# Patient Record
Sex: Male | Born: 1995
Health system: Southern US, Community
[De-identification: ages and names within clinical notes are randomized; demographics above are authoritative.]

## PROBLEM LIST (undated history)

## (undated) DIAGNOSIS — R569 Unspecified convulsions: Secondary | ICD-10-CM

## (undated) DIAGNOSIS — T7840XA Allergy, unspecified, initial encounter: Secondary | ICD-10-CM

## (undated) DIAGNOSIS — S42023A Displaced fracture of shaft of unspecified clavicle, initial encounter for closed fracture: Secondary | ICD-10-CM

## (undated) HISTORY — DX: Allergy, unspecified, initial encounter: T78.40XA

## (undated) HISTORY — DX: Displaced fracture of shaft of unspecified clavicle, initial encounter for closed fracture: S42.023A

---
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
## 2016-01-18 ENCOUNTER — Ambulatory Visit (INDEPENDENT_AMBULATORY_CARE_PROVIDER_SITE_OTHER): Payer: 59 | Admitting: Family

## 2016-01-18 ENCOUNTER — Encounter: Payer: Self-pay | Admitting: Family

## 2016-01-18 VITALS — BP 116/68 | HR 85 | Temp 98.0°F | Ht 69.0 in | Wt 144.4 lb

## 2016-01-18 DIAGNOSIS — R0981 Nasal congestion: Secondary | ICD-10-CM | POA: Diagnosis not present

## 2016-01-18 MED ORDER — GUAIFENESIN ER 600 MG PO TB12: 1200.0000 mg | ORAL_TABLET | Freq: Two times a day (BID) | ORAL | Status: DC | PRN

## 2016-01-18 MED ORDER — FLUTICASONE PROPIONATE 50 MCG/ACT NA SUSP: 2.0000 | Freq: Every day | NASAL | Status: DC

## 2016-01-18 NOTE — Patient Instructions (Signed)
Flonase , alternating with nasal saline for nose.   Mucinex.   Increase intake of clear fluids. Congestion is best treated by hydration, when mucus is wetter, it is thinner, less sticky, and easier to expel from the body, either through coughing up drainage, or by blowing your nose.   Get plenty of rest.   Use saline nasal drops and blow your nose frequently. Run a humidifier at night and elevate the head of the bed. Vicks Vapor rub will help with congestion and cough. Steam showers and sinus massage for congestion.   Use Acetaminophen or Ibuprofen as needed for fever or pain. Avoid second hand smoke. Even the smallest exposure will worsen symptoms.   Over the counter medications you can try include Delsym for cough, a decongestant for congestion, and Mucinex or Robitussin as an expectorant. Be sure to just get the plain Mucinex or Robitussin that just has one medication (Guaifenesen). We don't recommend the combination products. Note, be sure to drink two glasses of water with each dose of Mucinex as the medication will not work well without adequate hydration.   You can also try a teaspoon of honey to see if this will help reduce cough. Throat lozenges can sometimes be beneficial as well.    This illness will typically last 7 - 10 days.   Please follow up with our clinic if you develop a fever greater than 101 F, symptoms worsen, or do not resolve in the next week.

## 2016-01-18 NOTE — Progress Notes (Signed)
Subjective:    Patient ID: Martin Gonzalez, male    DOB: 1996-08-02, 20 y.o.   MRN: [id number]   Martin Gonzalez is a 20 y.o. male who presents today for an acute visit.    HPI Comments: H/o seasonal allergies.   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 Was given Mucinex but has since run out.    Sinus Problem This is a new problem. The current episode started more than 1 month ago. The problem has been waxing and waning since onset. There has been no fever. The pain is mild. Associated symptoms include congestion and sinus pressure. Pertinent negatives include no chills, coughing, ear pain, headaches, neck pain, shortness of breath or sore throat. Treatments tried: zyrtec, 'sinus pills' The treatment provided mild relief.   Past Medical History  Diagnosis Date  . Allergy    Allergies: Review of patient's allergies indicates no known allergies. Current Outpatient Prescriptions on File Prior to Visit  Medication Sig Dispense Refill  . benzonatate (TESSALON) 200 MG capsule Take 1 capsule (200 mg total) by mouth 3 (three) times daily as needed for cough. (Patient not taking: Reported on 01/18/2016) 21 capsule 0   No current facility-administered medications on file prior to visit.    Social History  Substance Use Topics  . Smoking status: Never Smoker   . Smokeless tobacco: Never Used  . Alcohol Use: No    Review of Systems  Constitutional: Negative for fever and chills.  HENT: Positive for congestion and sinus pressure. Negative for ear pain and sore throat.   Respiratory: Negative for cough, shortness of breath and wheezing.   Cardiovascular: Negative for chest pain and palpitations.  Gastrointestinal: Negative for nausea and vomiting.  Musculoskeletal: Negative for neck pain.  Neurological: Negative for headaches.      Objective:    BP 116/68 mmHg  Pulse 85  Temp(Src) 98 F (36.7 C) (Oral)  Ht 5\' 9"  (1.753 m)  Wt 144 lb 6 oz  (65.488 kg)  BMI 21.31 kg/m2  SpO2 97%   Physical Exam  Constitutional: Vital signs are normal. He appears well-developed and well-nourished.  HENT:  Head: Normocephalic and atraumatic.  Right Ear: Hearing, tympanic membrane, external ear and ear canal normal. No drainage, swelling or tenderness. Tympanic membrane is not injected, not erythematous and not bulging. No middle ear effusion. No decreased hearing is noted.  Left Ear: Hearing, tympanic membrane, external ear and ear canal normal. No drainage, swelling or tenderness. Tympanic membrane is not injected, not erythematous and not bulging.  No middle ear effusion. No decreased hearing is noted.  Nose: Nose normal. Right sinus exhibits no maxillary sinus tenderness and no frontal sinus tenderness. Left sinus exhibits no maxillary sinus tenderness and no frontal sinus tenderness.  Mouth/Throat: Uvula is midline, oropharynx is clear and moist and mucous membranes are normal. No oropharyngeal exudate, posterior oropharyngeal edema, posterior oropharyngeal erythema or tonsillar abscesses.  Eyes: Conjunctivae are normal.  Cardiovascular: Regular rhythm and normal heart sounds.   Pulmonary/Chest: Effort normal and breath sounds normal. No respiratory distress. He has no wheezes. He has no rhonchi. He has no rales.  Lymphadenopathy:       Head (right side): No submental, no submandibular, no tonsillar, no preauricular, no posterior auricular and no occipital adenopathy present.       Head (left side): No submental, no submandibular, no tonsillar, no preauricular, no posterior auricular and no occipital adenopathy present.    He has no cervical  adenopathy.  Neurological: He is alert.  Skin: Skin is warm and dry.  Psychiatric: He has a normal mood and affect. His speech is normal and behavior is normal.  Vitals reviewed.      Assessment & Plan:    1. Sinus congestion Working diagnosis of viral versus allergic etiology. No evidence of  bacterial infection at this time. We both agreed on conservative therapy at this time.  - guaiFENesin (MUCINEX) 600 MG 12 hr tablet; Take 2 tablets (1,200 mg total) by mouth 2 (two) times daily as needed for cough or to loosen phlegm.  Dispense: 60 tablet; Refill: 1 - fluticasone (FLONASE) 50 MCG/ACT nasal spray; Place 2 sprays into both nostrils daily.  Dispense: 16 g; Refill: 2   I am having [name] maintain his benzonatate.   No orders of the defined types were placed in this encounter.     Start medications as prescribed and explained to patient on After Visit Summary ( AVS). Risks, benefits, and alternatives of the medications and treatment plan prescribed today were discussed, and patient expressed understanding.   Education regarding symptom management and diagnosis given to patient.   Follow-up:Plan follow-up as discussed or as needed if any worsening symptoms or change in condition.   Continue to follow with [name], MD for routine health maintenance.   Martin Poot and I agreed with plan.   Rennie Plowman, FNP

## 2016-01-18 NOTE — Progress Notes (Signed)
Pre visit review using our clinic review tool, if applicable. No additional management support is needed unless otherwise documented below in the visit note. 

## 2016-06-15 ENCOUNTER — Ambulatory Visit (INDEPENDENT_AMBULATORY_CARE_PROVIDER_SITE_OTHER): Payer: 59

## 2016-06-15 DIAGNOSIS — Z23 Encounter for immunization: Secondary | ICD-10-CM | POA: Diagnosis not present

## 2016-07-17 ENCOUNTER — Encounter: Payer: Self-pay | Admitting: Family

## 2016-07-17 ENCOUNTER — Ambulatory Visit (INDEPENDENT_AMBULATORY_CARE_PROVIDER_SITE_OTHER): Payer: 59 | Admitting: Family

## 2016-07-17 VITALS — BP 130/62 | HR 73 | Temp 98.3°F | Ht 69.0 in | Wt 150.0 lb

## 2016-07-17 DIAGNOSIS — Z202 Contact with and (suspected) exposure to infections with a predominantly sexual mode of transmission: Secondary | ICD-10-CM | POA: Diagnosis not present

## 2016-07-17 DIAGNOSIS — R0981 Nasal congestion: Secondary | ICD-10-CM

## 2016-07-17 NOTE — Patient Instructions (Signed)
Will call with test results  Mucinex- and lots of water.  Let me know if not better.

## 2016-07-17 NOTE — Progress Notes (Signed)
Subjective:    Patient ID: Martin Gonzalez, male    DOB: 05-02-1996, 20 y.o.   MRN: [id number]  CC: Martin Gonzalez is a 20 y.o. male who presents today for an acute visit.    HPI: Patient for acute visit for thick nasal congestion for one week, unchanged. No fevers, chills, sore throat, ear pain, wheezing, SOB. Tried some ibuprofen. Seasonal allergies.   Also desires STD testing after unprotected sex over the weekend with a male. No penile discharge, dysuria, penile pain.      HISTORY:  Past Medical History:  Diagnosis Date  . Allergy    History reviewed. No pertinent surgical history. Family History  Problem Relation Age of Onset  . Cancer Maternal Aunt     Breast  . Diabetes Maternal Grandmother     Allergies: Patient has no known allergies. Current Outpatient Prescriptions on File Prior to Visit  Medication Sig Dispense Refill  . benzonatate (TESSALON) 200 MG capsule Take 1 capsule (200 mg total) by mouth 3 (three) times daily as needed for cough. 21 capsule 0  . fluticasone (FLONASE) 50 MCG/ACT nasal spray Place 2 sprays into both nostrils daily. 16 g 2  . guaiFENesin (MUCINEX) 600 MG 12 hr tablet Take 2 tablets (1,200 mg total) by mouth 2 (two) times daily as needed for cough or to loosen phlegm. 60 tablet 1   No current facility-administered medications on file prior to visit.     Social History  Substance Use Topics  . Smoking status: Never Smoker  . Smokeless tobacco: Never Used  . Alcohol use No    Review of Systems  Constitutional: Negative for chills and fever.  HENT: Positive for congestion. Negative for ear pain, sinus pain, sinus pressure and sore throat.   Respiratory: Negative for cough, shortness of breath and wheezing.   Cardiovascular: Negative for chest pain and palpitations.  Gastrointestinal: Negative for nausea and vomiting.  Genitourinary: Negative for dysuria, genital sores, penile swelling, testicular pain and urgency.      Objective:      BP 130/62   Pulse 73   Temp 98.3 F (36.8 C) (Oral)   Ht 5\' 9"  (1.753 m)   Wt 150 lb (68 kg)   SpO2 97%   BMI 22.15 kg/m    Physical Exam  Constitutional: Vital signs are normal. He appears well-developed and well-nourished.  HENT:  Head: Normocephalic and atraumatic.  Right Ear: Hearing, tympanic membrane, external ear and ear canal normal. No drainage, swelling or tenderness. Tympanic membrane is not injected, not erythematous and not bulging. No middle ear effusion. No decreased hearing is noted.  Left Ear: Hearing, tympanic membrane, external ear and ear canal normal. No drainage, swelling or tenderness. Tympanic membrane is not injected, not erythematous and not bulging.  No middle ear effusion. No decreased hearing is noted.  Nose: Nose normal. Right sinus exhibits no maxillary sinus tenderness and no frontal sinus tenderness. Left sinus exhibits no maxillary sinus tenderness and no frontal sinus tenderness.  Mouth/Throat: Uvula is midline, oropharynx is clear and moist and mucous membranes are normal. No oropharyngeal exudate, posterior oropharyngeal edema, posterior oropharyngeal erythema or tonsillar abscesses.  Eyes: Conjunctivae are normal.  Cardiovascular: Regular rhythm and normal heart sounds.   Pulmonary/Chest: Effort normal and breath sounds normal. No respiratory distress. He has no wheezes. He has no rhonchi. He has no rales.  Lymphadenopathy:       Head (right side): No submental, no submandibular, no tonsillar, no preauricular, no posterior auricular  and no occipital adenopathy present.       Head (left side): No submental, no submandibular, no tonsillar, no preauricular, no posterior auricular and no occipital adenopathy present.    He has no cervical adenopathy.  Neurological: He is alert.  Skin: Skin is warm and dry.  Psychiatric: He has a normal mood and affect. His speech is normal and behavior is normal.  Vitals reviewed.      Assessment & Plan:   1. STD  exposure Asymptomatic. Testing as requested.   - GC/chlamydia probe amp, urine; Future - Acute Hep Panel & Hep B Surface Ab; Future - HIV antibody; Future - HSV(herpes smplx)abs-1+2(IgG+IgM)-bld; Future - RPR; Future - GC/chlamydia probe amp, urine - Acute Hep Panel & Hep B Surface Ab - HIV antibody - HSV(herpes smplx)abs-1+2(IgG+IgM)-bld - RPR  2. Sinus congestion Afebrile. Patient and I agreed to defer antibiotics at this time and treat conservatively with over-the-counter expectorant, Mucinex for the next couple of days. If symptoms do not improve or certainly worsen, patient wil let me know.  Return precautions given.    I am having Martin Gonzalez maintain his benzonatate, guaiFENesin, and fluticasone.   No orders of the defined types were placed in this encounter.   Return precautions given.   Risks, benefits, and alternatives of the medications and treatment plan prescribed today were discussed, and patient expressed understanding.   Education regarding symptom management and diagnosis given to patient on AVS.  Continue to follow with Martin BoydenJavier Gutierrez, MD for routine health maintenance.   Martin Gonzalez and I agreed with plan.   Martin PlowmanMargaret Arnett, FNP

## 2016-07-18 ENCOUNTER — Encounter: Payer: Self-pay | Admitting: Family

## 2016-07-18 NOTE — Progress Notes (Signed)
Pre visit review using our clinic review tool, if applicable. No additional management support is needed unless otherwise documented below in the visit note. 

## 2016-07-19 ENCOUNTER — Telehealth: Payer: Self-pay

## 2016-07-19 ENCOUNTER — Other Ambulatory Visit: Payer: Self-pay

## 2016-07-19 ENCOUNTER — Other Ambulatory Visit (HOSPITAL_COMMUNITY)
Admission: RE | Admit: 2016-07-19 | Discharge: 2016-07-19 | Disposition: A | Discharge status: Home / Self Care | Payer: 59 | Source: Ambulatory Visit | Attending: Family | Admitting: Family

## 2016-07-19 DIAGNOSIS — Z202 Contact with and (suspected) exposure to infections with a predominantly sexual mode of transmission: Secondary | ICD-10-CM

## 2016-07-19 DIAGNOSIS — Z113 Encounter for screening for infections with a predominantly sexual mode of transmission: Secondary | ICD-10-CM | POA: Diagnosis not present

## 2016-07-19 LAB — URINE CYTOLOGY ANCILLARY ONLY
Chlamydia: NEGATIVE
Neisseria Gonorrhea: NEGATIVE

## 2016-07-19 LAB — RPR

## 2016-07-19 LAB — ACUTE HEP PANEL AND HEP B SURFACE AB
HCV Ab: NEGATIVE
HEP A IGM: NONREACTIVE
HEP B C IGM: NONREACTIVE
Hepatitis B Surface Ag: NEGATIVE

## 2016-07-19 LAB — HIV ANTIBODY (ROUTINE TESTING W REFLEX): HIV 1&2 Ab, 4th Generation: NONREACTIVE

## 2016-07-19 NOTE — Telephone Encounter (Signed)
Noted. I have called [facility] and made them aware. They will send for both.

## 2016-07-19 NOTE — Telephone Encounter (Signed)
[facility] called regarding HSV I&II IgG,IgM lab order. The test code you ordered is no longer valid and they need authorization to change it. They have separate test codes for HSV I&II IgG and HSV I&II IgM now. Would you prefer to have IgG or IgM; or do you want both?

## 2016-07-19 NOTE — Telephone Encounter (Signed)
[facility] called regarding order for g/c probe. Order was placed incorrectly. I have changed the order so that it is correct. Resulting agency was not set to [facility].

## 2016-07-19 NOTE — Telephone Encounter (Signed)
Both is fine.thanks!

## 2016-07-20 LAB — HSV(HERPES SIMPLEX VRS) I + II AB-IGG: HSV 1 GLYCOPROTEIN G AB, IGG: 56.3 {index} — AB (ref <0.90)

## 2016-07-23 LAB — HSV 1/2 AB (IGM), IFA W/RFLX TITER
HSV 1 IGM SCREEN: NEGATIVE
HSV 2 IgM Screen: NEGATIVE

## 2016-07-24 ENCOUNTER — Telehealth: Payer: Self-pay | Admitting: Family Medicine

## 2016-07-24 NOTE — Telephone Encounter (Signed)
Patient has been informed.

## 2016-07-24 NOTE — Telephone Encounter (Signed)
Pt lvm stating that he was returning Brocks phone call.

## 2016-09-11 DIAGNOSIS — S42023A Displaced fracture of shaft of unspecified clavicle, initial encounter for closed fracture: Secondary | ICD-10-CM

## 2016-09-11 HISTORY — DX: Displaced fracture of shaft of unspecified clavicle, initial encounter for closed fracture: S42.023A

## 2017-01-05 ENCOUNTER — Ambulatory Visit (INDEPENDENT_AMBULATORY_CARE_PROVIDER_SITE_OTHER): Payer: 59 | Admitting: Family Medicine

## 2017-01-05 ENCOUNTER — Encounter: Payer: Self-pay | Admitting: Family Medicine

## 2017-01-05 VITALS — BP 124/78 | HR 83 | Temp 97.4°F | Wt 153.6 lb

## 2017-01-05 DIAGNOSIS — B9789 Other viral agents as the cause of diseases classified elsewhere: Secondary | ICD-10-CM | POA: Diagnosis not present

## 2017-01-05 DIAGNOSIS — J069 Acute upper respiratory infection, unspecified: Secondary | ICD-10-CM | POA: Diagnosis not present

## 2017-01-05 NOTE — Patient Instructions (Signed)
For nasal congestion you can use Afrin nasal spray for 3 days max, Sudafed, saline nasal spray (generic is fine for all). For cough you can try Delsym. Drink enough fluids to make your urine light yellow. For fever/chill/muscle aches you can take over the counter acetaminophen or ibuprofen.  You can take a long acting antihistamine like Zyrtec, Claritin or Allegra (generic is fine) Please come back in if you are not better in 5-7 days or if you develop wheezing, shortness of breath or persistent vomiting.

## 2017-01-05 NOTE — Progress Notes (Signed)
Pre visit review using our clinic review tool, if applicable. No additional management support is needed unless otherwise documented below in the visit note. 

## 2017-01-05 NOTE — Progress Notes (Signed)
   Subjective:    Patient ID: Martin Gonzalez, male    DOB: 09/11/1996, 21 y.o.   MRN: [id number]  HPI This is a 21 yo male who presents today with 1 week of nasal congestion and sore throat. A little non productive cough. Nasal drainage bloody at times, mostly yellow. No headache, feeling better today.  Some SOB, no wheeze. No fever. No history of asthma or smoking. Has taken some "Allergy Relief," medication without improvement. A close friend has similar symptoms. Requests work note.  Works Engineering geologist at [employer]. Planning to go back to school next semester.   Past Medical History:  Diagnosis Date  . Allergy    No past surgical history on file. Family History  Problem Relation Age of Onset  . Cancer Maternal Aunt     Breast  . Diabetes Maternal Grandmother    Social History  Substance Use Topics  . Smoking status: Never Smoker  . Smokeless tobacco: Never Used  . Alcohol use No      Review of Systems Per HPI    Objective:   Physical Exam  Constitutional: He is oriented to person, place, and time. He appears well-developed and well-nourished. No distress.  HENT:  Head: Normocephalic and atraumatic.  Right Ear: External ear normal.  Left Ear: External ear normal.  Nose: Nose normal.  Mouth/Throat: Oropharynx is clear and moist. No oropharyngeal exudate.  Eyes: Conjunctivae are normal.  Neck: Normal range of motion. Neck supple.  Cardiovascular: Normal rate, regular rhythm and normal heart sounds.   Pulmonary/Chest: Effort normal and breath sounds normal.  Neurological: He is alert and oriented to person, place, and time.  Skin: Skin is warm and dry. He is not diaphoretic.  Psychiatric: He has a normal mood and affect. His behavior is normal. Judgment and thought content normal.  Vitals reviewed.        BP 124/78 (BP Location: Right Arm, Patient Position: Sitting, Cuff Size: Normal)   Pulse 83   Temp 97.4 F (36.3 C) (Oral)   Wt 153 lb 9.6 oz (69.7 kg)   SpO2 98%    BMI 22.68 kg/m  Wt Readings from Last 3 Encounters:  01/05/17 153 lb 9.6 oz (69.7 kg)  07/18/16 150 lb (68 kg) (42 %, Z= -0.21)*  01/18/16 144 lb 6 oz (65.5 kg) (35 %, Z= -0.39)*   * Growth percentiles are based on CDC 2-20 Years data.    Assessment & Plan:  1. Viral URI with cough  Patient Instructions  For nasal congestion you can use Afrin nasal spray for 3 days max, Sudafed, saline nasal spray (generic is fine for all). For cough you can try Delsym. Drink enough fluids to make your urine light yellow. For fever/chill/muscle aches you can take over the counter acetaminophen or ibuprofen.  You can take a long acting antihistamine like Zyrtec, Claritin or Allegra (generic is fine) Please come back in if you are not better in 5-7 days or if you develop wheezing, shortness of breath or persistent vomiting.    Olean Ree, FNP-BC  [facility] [place], [facility]  01/05/2017 2:02 PM

## 2017-06-19 ENCOUNTER — Emergency Department
Admission: EM | Admit: 2017-06-19 | Discharge: 2017-06-19 | Disposition: A | Discharge status: Home / Self Care | Payer: 59 | Attending: Student in an Organized Health Care Education/Training Program | Admitting: Student in an Organized Health Care Education/Training Program

## 2017-06-19 ENCOUNTER — Emergency Department: Payer: 59

## 2017-06-19 ENCOUNTER — Encounter: Payer: Self-pay | Admitting: Emergency Medicine

## 2017-06-19 DIAGNOSIS — S4992XA Unspecified injury of left shoulder and upper arm, initial encounter: Secondary | ICD-10-CM | POA: Diagnosis present

## 2017-06-19 DIAGNOSIS — M25512 Pain in left shoulder: Secondary | ICD-10-CM | POA: Diagnosis not present

## 2017-06-19 DIAGNOSIS — Y9389 Activity, other specified: Secondary | ICD-10-CM | POA: Diagnosis not present

## 2017-06-19 DIAGNOSIS — Y92410 Unspecified street and highway as the place of occurrence of the external cause: Secondary | ICD-10-CM | POA: Diagnosis not present

## 2017-06-19 DIAGNOSIS — Z79899 Other long term (current) drug therapy: Secondary | ICD-10-CM | POA: Diagnosis not present

## 2017-06-19 DIAGNOSIS — Y998 Other external cause status: Secondary | ICD-10-CM | POA: Insufficient documentation

## 2017-06-19 DIAGNOSIS — S42022A Displaced fracture of shaft of left clavicle, initial encounter for closed fracture: Secondary | ICD-10-CM | POA: Insufficient documentation

## 2017-06-19 DIAGNOSIS — S42002A Fracture of unspecified part of left clavicle, initial encounter for closed fracture: Secondary | ICD-10-CM | POA: Diagnosis not present

## 2017-06-19 MED ORDER — OXYCODONE-ACETAMINOPHEN 5-325 MG PO TABS: 1.0000 | ORAL_TABLET | Freq: Four times a day (QID) | ORAL | 0 refills | Status: DC | PRN

## 2017-06-19 MED ORDER — OXYCODONE-ACETAMINOPHEN 5-325 MG PO TABS
1.0000 | ORAL_TABLET | Freq: Once | ORAL | Status: AC
  Administered 2017-06-19: 1 via ORAL
  Filled 2017-06-19: qty 1

## 2017-06-19 MED ORDER — FENTANYL CITRATE (PF) 100 MCG/2ML IJ SOLN
50.0000 ug | Freq: Once | INTRAMUSCULAR | Status: AC
  Administered 2017-06-19: 50 ug via INTRAVENOUS
  Filled 2017-06-19: qty 2

## 2017-06-19 MED ORDER — NAPROXEN 500 MG PO TABS: 500.0000 mg | ORAL_TABLET | Freq: Two times a day (BID) | ORAL | 0 refills | Status: DC
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
## 2017-06-21 DIAGNOSIS — S42002A Fracture of unspecified part of left clavicle, initial encounter for closed fracture: Secondary | ICD-10-CM | POA: Diagnosis not present

## 2017-07-05 DIAGNOSIS — S42002D Fracture of unspecified part of left clavicle, subsequent encounter for fracture with routine healing: Secondary | ICD-10-CM | POA: Diagnosis not present

## 2017-07-11 ENCOUNTER — Encounter: Payer: Self-pay | Admitting: Family Medicine

## 2018-05-13 ENCOUNTER — Emergency Department: Payer: 59

## 2018-05-13 ENCOUNTER — Emergency Department
Admission: EM | Admit: 2018-05-13 | Discharge: 2018-05-13 | Disposition: A | Discharge status: Home / Self Care | Payer: 59 | Attending: Emergency Medicine | Admitting: Emergency Medicine

## 2018-05-13 DIAGNOSIS — Y998 Other external cause status: Secondary | ICD-10-CM | POA: Diagnosis not present

## 2018-05-13 DIAGNOSIS — Y939 Activity, unspecified: Secondary | ICD-10-CM | POA: Insufficient documentation

## 2018-05-13 DIAGNOSIS — S43015A Anterior dislocation of left humerus, initial encounter: Secondary | ICD-10-CM | POA: Diagnosis not present

## 2018-05-13 DIAGNOSIS — Y33XXXA Other specified events, undetermined intent, initial encounter: Secondary | ICD-10-CM | POA: Insufficient documentation

## 2018-05-13 DIAGNOSIS — Y929 Unspecified place or not applicable: Secondary | ICD-10-CM | POA: Insufficient documentation

## 2018-05-13 DIAGNOSIS — S43005A Unspecified dislocation of left shoulder joint, initial encounter: Secondary | ICD-10-CM | POA: Diagnosis not present

## 2018-05-13 DIAGNOSIS — Z79899 Other long term (current) drug therapy: Secondary | ICD-10-CM | POA: Diagnosis not present

## 2018-05-13 DIAGNOSIS — R569 Unspecified convulsions: Secondary | ICD-10-CM | POA: Diagnosis not present

## 2018-05-13 LAB — URINE DRUG SCREEN, QUALITATIVE (ARMC ONLY)
AMPHETAMINES, UR SCREEN: NOT DETECTED
BARBITURATES, UR SCREEN: NOT DETECTED
Cannabinoid 50 Ng, Ur ~~LOC~~: NOT DETECTED
Cocaine Metabolite,Ur ~~LOC~~: NOT DETECTED
MDMA (ECSTASY) UR SCREEN: NOT DETECTED
Methadone Scn, Ur: NOT DETECTED
Opiate, Ur Screen: NOT DETECTED
Phencyclidine (PCP) Ur S: NOT DETECTED
Tricyclic, Ur Screen: NOT DETECTED

## 2018-05-13 LAB — CBC WITH DIFFERENTIAL/PLATELET
Basophils Absolute: 0 10*3/uL (ref 0–0.1)
Basophils Relative: 0 %
EOS ABS: 0 10*3/uL (ref 0–0.7)
Eosinophils Relative: 0 %
HCT: 44.6 % (ref 40.0–52.0)
HEMOGLOBIN: 15.7 g/dL (ref 13.0–18.0)
LYMPHS ABS: 3.9 10*3/uL — AB (ref 1.0–3.6)
Lymphocytes Relative: 26 %
MCH: 31.9 pg (ref 26.0–34.0)
MCHC: 35.2 g/dL (ref 32.0–36.0)
MCV: 90.7 fL (ref 80.0–100.0)
MONO ABS: 1 10*3/uL (ref 0.2–1.0)
MONOS PCT: 7 %
NEUTROS PCT: 67 %
Neutro Abs: 10 10*3/uL — ABNORMAL HIGH (ref 1.4–6.5)
Platelets: 305 10*3/uL (ref 150–440)
RBC: 4.92 MIL/uL (ref 4.40–5.90)
RDW: 13.2 % (ref 11.5–14.5)
WBC: 15 10*3/uL — ABNORMAL HIGH (ref 3.8–10.6)

## 2018-05-13 LAB — BASIC METABOLIC PANEL
Anion gap: 13 (ref 5–15)
BUN: 15 mg/dL (ref 6–20)
CHLORIDE: 103 mmol/L (ref 98–111)
CO2: 23 mmol/L (ref 22–32)
CREATININE: 1.26 mg/dL — AB (ref 0.61–1.24)
Calcium: 9.7 mg/dL (ref 8.9–10.3)
GFR calc Af Amer: >60 mL/min (ref >60)
GFR calc non Af Amer: >60 mL/min (ref >60)
Glucose, Bld: 142 mg/dL — ABNORMAL HIGH (ref 70–99)
Potassium: 3.7 mmol/L (ref 3.5–5.1)
SODIUM: 139 mmol/L (ref 135–145)

## 2018-05-13 LAB — TROPONIN I

## 2018-05-13 NOTE — ED Triage Notes (Signed)
Pt arrived from [employer] via EMS with reports of a seizure. Pt is alert but not oriented. VS per EMS BS-119 BP-130/84 HR-86 O2sat-97% No Hx of Seizures. Pt is diaphoretic and restless. Pt has obvious deformity to collarbone. Pt states its an injury from last October. Pt denies injury and denies hitting head.
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
## 2018-05-13 NOTE — Discharge Instructions (Addendum)
As we discussed please do not drive, go up on roofs, go swimming or put yourself or others in a dangerous position if you were to have another seizure until cleared by neurology.  Please follow-up with orthopedics for the shoulder dislocation.

## 2018-05-16 DIAGNOSIS — S43005A Unspecified dislocation of left shoulder joint, initial encounter: Secondary | ICD-10-CM | POA: Diagnosis not present

## 2018-05-20 ENCOUNTER — Encounter: Payer: Self-pay | Admitting: Family Medicine

## 2018-05-20 ENCOUNTER — Ambulatory Visit: Payer: 59 | Admitting: Family Medicine

## 2018-05-20 VITALS — BP 118/78 | HR 70 | Temp 97.8°F | Ht 69.0 in | Wt 159.5 lb

## 2018-05-20 DIAGNOSIS — R569 Unspecified convulsions: Secondary | ICD-10-CM | POA: Diagnosis not present

## 2018-05-20 DIAGNOSIS — G40909 Epilepsy, unspecified, not intractable, without status epilepticus: Secondary | ICD-10-CM | POA: Insufficient documentation

## 2018-05-20 DIAGNOSIS — M24412 Recurrent dislocation, left shoulder: Secondary | ICD-10-CM | POA: Insufficient documentation

## 2018-05-20 DIAGNOSIS — S43015A Anterior dislocation of left humerus, initial encounter: Secondary | ICD-10-CM | POA: Diagnosis not present

## 2018-05-20 DIAGNOSIS — Z23 Encounter for immunization: Secondary | ICD-10-CM

## 2018-05-20 NOTE — Patient Instructions (Addendum)
Flu shot today.  Keep follow up with Dr Malvin Johns later today.  Good to see you today. Call us with questions.  Out of work through Friday.

## 2018-05-20 NOTE — Assessment & Plan Note (Signed)
S/p relocation in ER. He has seen ortho and has planned PT for strengthening - first session will be this Friday.

## 2018-05-20 NOTE — Assessment & Plan Note (Addendum)
Likely seizure given concommittant shoulder dislocation. Unclear cause - CT was benign, no electrolyte abnormalities on labs, normal cbg (and he had just eaten prior to event). Upcoming neuro appt. Discussed possible need to avoid driving for 6 months.  Written out of work through the rest of this week.  Encouraged regular meal schedule, better sleep hygiene with goal 7-8 hours nightly, ensure good hydration status.
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
## 2018-05-24 DIAGNOSIS — R600 Localized edema: Secondary | ICD-10-CM | POA: Diagnosis not present

## 2018-05-24 DIAGNOSIS — M25512 Pain in left shoulder: Secondary | ICD-10-CM | POA: Diagnosis not present

## 2018-05-24 DIAGNOSIS — M25611 Stiffness of right shoulder, not elsewhere classified: Secondary | ICD-10-CM | POA: Diagnosis not present

## 2018-05-24 DIAGNOSIS — M25612 Stiffness of left shoulder, not elsewhere classified: Secondary | ICD-10-CM | POA: Diagnosis not present

## 2018-05-24 DIAGNOSIS — M25511 Pain in right shoulder: Secondary | ICD-10-CM | POA: Diagnosis not present

## 2018-05-26 DIAGNOSIS — R569 Unspecified convulsions: Secondary | ICD-10-CM | POA: Diagnosis not present

## 2018-05-28 ENCOUNTER — Other Ambulatory Visit: Payer: Self-pay | Admitting: Neurology

## 2018-05-28 DIAGNOSIS — R569 Unspecified convulsions: Secondary | ICD-10-CM

## 2018-06-17 ENCOUNTER — Ambulatory Visit
Admission: RE | Admit: 2018-06-17 | Discharge: 2018-06-17 | Disposition: A | Discharge status: Home / Self Care | Payer: 59 | Source: Ambulatory Visit | Attending: Neurology | Admitting: Neurology

## 2018-06-17 DIAGNOSIS — R569 Unspecified convulsions: Secondary | ICD-10-CM | POA: Insufficient documentation

## 2018-06-17 DIAGNOSIS — R55 Syncope and collapse: Secondary | ICD-10-CM | POA: Diagnosis not present

## 2018-06-17 MED ORDER — GADOBUTROL 1 MMOL/ML IV SOLN
7.5000 mL | Freq: Once | INTRAVENOUS | Status: AC | PRN
  Administered 2018-06-17: 7.5 mL via INTRAVENOUS

## 2018-08-09 ENCOUNTER — Emergency Department: Payer: 59

## 2018-08-09 ENCOUNTER — Emergency Department
Admission: EM | Admit: 2018-08-09 | Discharge: 2018-08-09 | Disposition: A | Discharge status: Home / Self Care | Payer: 59 | Attending: Emergency Medicine | Admitting: Emergency Medicine

## 2018-08-09 ENCOUNTER — Other Ambulatory Visit: Payer: Self-pay

## 2018-08-09 ENCOUNTER — Emergency Department: Payer: 59 | Admitting: Certified Registered"

## 2018-08-09 ENCOUNTER — Encounter: Payer: Self-pay | Admitting: Emergency Medicine

## 2018-08-09 ENCOUNTER — Encounter
Admission: EM | Disposition: A | Discharge status: Home / Self Care | Payer: Self-pay | Source: Home / Self Care | Attending: Emergency Medicine

## 2018-08-09 DIAGNOSIS — G40909 Epilepsy, unspecified, not intractable, without status epilepticus: Secondary | ICD-10-CM | POA: Diagnosis not present

## 2018-08-09 DIAGNOSIS — S43005A Unspecified dislocation of left shoulder joint, initial encounter: Secondary | ICD-10-CM | POA: Diagnosis not present

## 2018-08-09 DIAGNOSIS — Y990 Civilian activity done for income or pay: Secondary | ICD-10-CM | POA: Insufficient documentation

## 2018-08-09 DIAGNOSIS — Q899 Congenital malformation, unspecified: Secondary | ICD-10-CM | POA: Diagnosis present

## 2018-08-09 DIAGNOSIS — M24412 Recurrent dislocation, left shoulder: Secondary | ICD-10-CM

## 2018-08-09 DIAGNOSIS — R569 Unspecified convulsions: Secondary | ICD-10-CM

## 2018-08-09 DIAGNOSIS — S43015A Anterior dislocation of left humerus, initial encounter: Secondary | ICD-10-CM | POA: Insufficient documentation

## 2018-08-09 DIAGNOSIS — S43035A Inferior dislocation of left humerus, initial encounter: Secondary | ICD-10-CM | POA: Diagnosis not present

## 2018-08-09 DIAGNOSIS — S43002A Unspecified subluxation of left shoulder joint, initial encounter: Secondary | ICD-10-CM | POA: Diagnosis not present

## 2018-08-09 HISTORY — PX: SHOULDER CLOSED REDUCTION: SHX1051

## 2018-08-09 HISTORY — DX: Unspecified convulsions: R56.9

## 2018-08-09 LAB — URINE DRUG SCREEN, QUALITATIVE (ARMC ONLY)
Amphetamines, Ur Screen: NOT DETECTED
BARBITURATES, UR SCREEN: NOT DETECTED
Benzodiazepine, Ur Scrn: POSITIVE — AB
COCAINE METABOLITE, UR ~~LOC~~: NOT DETECTED
Cannabinoid 50 Ng, Ur ~~LOC~~: NOT DETECTED
MDMA (ECSTASY) UR SCREEN: NOT DETECTED
METHADONE SCREEN, URINE: NOT DETECTED
Opiate, Ur Screen: NOT DETECTED
Phencyclidine (PCP) Ur S: NOT DETECTED
TRICYCLIC, UR SCREEN: NOT DETECTED

## 2018-08-09 LAB — CBC WITH DIFFERENTIAL/PLATELET
Abs Immature Granulocytes: 0.17 10*3/uL — ABNORMAL HIGH (ref 0.00–0.07)
BASOS ABS: 0.1 10*3/uL (ref 0.0–0.1)
Basophils Relative: 0 %
EOS ABS: 0 10*3/uL (ref 0.0–0.5)
Eosinophils Relative: 0 %
HEMATOCRIT: 51.6 % (ref 39.0–52.0)
Hemoglobin: 16.1 g/dL (ref 13.0–17.0)
Immature Granulocytes: 1 %
LYMPHS ABS: 3.4 10*3/uL (ref 0.7–4.0)
Lymphocytes Relative: 22 %
MCH: 30.3 pg (ref 26.0–34.0)
MCHC: 31.2 g/dL (ref 30.0–36.0)
MCV: 97.2 fL (ref 80.0–100.0)
Monocytes Absolute: 1 10*3/uL (ref 0.1–1.0)
Monocytes Relative: 6 %
NEUTROS PCT: 71 %
NRBC: 0 % (ref 0.0–0.2)
Neutro Abs: 11.2 10*3/uL — ABNORMAL HIGH (ref 1.7–7.7)
Platelets: 283 10*3/uL (ref 150–400)
RBC: 5.31 MIL/uL (ref 4.22–5.81)
RDW: 11.9 % (ref 11.5–15.5)
WBC: 15.9 10*3/uL — ABNORMAL HIGH (ref 4.0–10.5)

## 2018-08-09 LAB — COMPREHENSIVE METABOLIC PANEL
ALBUMIN: 5.4 g/dL — AB (ref 3.5–5.0)
ALK PHOS: 70 U/L (ref 38–126)
ALT: 20 U/L (ref 0–44)
ANION GAP: 26 — AB (ref 5–15)
AST: 44 U/L — ABNORMAL HIGH (ref 15–41)
BUN: 17 mg/dL (ref 6–20)
CALCIUM: 10.1 mg/dL (ref 8.9–10.3)
CHLORIDE: 101 mmol/L (ref 98–111)
CO2: 15 mmol/L — AB (ref 22–32)
Creatinine, Ser: 1.4 mg/dL — ABNORMAL HIGH (ref 0.61–1.24)
GFR calc Af Amer: >60 mL/min (ref >60)
GFR calc non Af Amer: >60 mL/min (ref >60)
GLUCOSE: 170 mg/dL — AB (ref 70–99)
Potassium: 4.1 mmol/L (ref 3.5–5.1)
SODIUM: 142 mmol/L (ref 135–145)
Total Bilirubin: 0.8 mg/dL (ref 0.3–1.2)
Total Protein: 9.4 g/dL — ABNORMAL HIGH (ref 6.5–8.1)

## 2018-08-09 LAB — ETHANOL: Alcohol, Ethyl (B): <10 mg/dL (ref <10)

## 2018-08-09 SURGERY — CLOSED REDUCTION, SHOULDER
Anesthesia: General | Laterality: Left

## 2018-08-09 MED ORDER — PROPOFOL 10 MG/ML IV BOLUS
INTRAVENOUS | Status: DC | PRN
  Administered 2018-08-09: 150 mg via INTRAVENOUS

## 2018-08-09 MED ORDER — ROCURONIUM BROMIDE 50 MG/5ML IV SOLN
INTRAVENOUS | Status: AC
  Filled 2018-08-09: qty 1

## 2018-08-09 MED ORDER — LIDOCAINE HCL (CARDIAC) PF 100 MG/5ML IV SOSY
PREFILLED_SYRINGE | INTRAVENOUS | Status: DC | PRN
  Administered 2018-08-09: 100 mg via INTRAVENOUS

## 2018-08-09 MED ORDER — PROPOFOL 10 MG/ML IV BOLUS
INTRAVENOUS | Status: AC
  Filled 2018-08-09: qty 20

## 2018-08-09 MED ORDER — MIDAZOLAM HCL 2 MG/2ML IJ SOLN
INTRAMUSCULAR | Status: AC
  Filled 2018-08-09: qty 2

## 2018-08-09 MED ORDER — MIDAZOLAM HCL 2 MG/2ML IJ SOLN
INTRAMUSCULAR | Status: DC | PRN
  Administered 2018-08-09: 2 mg via INTRAVENOUS

## 2018-08-09 MED ORDER — LACTATED RINGERS IV SOLN
INTRAVENOUS | Status: DC | PRN
  Administered 2018-08-09: 20:00:00 via INTRAVENOUS

## 2018-08-09 MED ORDER — SUCCINYLCHOLINE CHLORIDE 20 MG/ML IJ SOLN
INTRAMUSCULAR | Status: AC
  Filled 2018-08-09: qty 1

## 2018-08-09 MED ORDER — LIDOCAINE HCL (PF) 2 % IJ SOLN
INTRAMUSCULAR | Status: AC
  Filled 2018-08-09: qty 10

## 2018-08-09 MED ORDER — LEVETIRACETAM 500 MG PO TABS: 500.0000 mg | ORAL_TABLET | Freq: Two times a day (BID) | ORAL | 1 refills | Status: DC

## 2018-08-09 MED ORDER — LEVETIRACETAM IN NACL 1500 MG/100ML IV SOLN
1500.0000 mg | Freq: Once | INTRAVENOUS | Status: AC
  Administered 2018-08-09: 1500 mg via INTRAVENOUS
  Filled 2018-08-09: qty 100

## 2018-08-09 MED ORDER — ONDANSETRON HCL 4 MG/2ML IJ SOLN
INTRAMUSCULAR | Status: DC | PRN
  Administered 2018-08-09: 4 mg via INTRAVENOUS

## 2018-08-09 MED ORDER — FENTANYL CITRATE (PF) 100 MCG/2ML IJ SOLN
INTRAMUSCULAR | Status: AC
  Filled 2018-08-09: qty 2

## 2018-08-09 MED ORDER — FENTANYL CITRATE (PF) 100 MCG/2ML IJ SOLN: 25.0000 ug | INTRAMUSCULAR | Status: DC | PRN

## 2018-08-09 MED ORDER — SUCCINYLCHOLINE CHLORIDE 20 MG/ML IJ SOLN
INTRAMUSCULAR | Status: DC | PRN
  Administered 2018-08-09: 100 mg via INTRAVENOUS

## 2018-08-09 MED ORDER — HYDROCODONE-ACETAMINOPHEN 5-325 MG PO TABS: 1.0000 | ORAL_TABLET | ORAL | 0 refills | Status: DC | PRN

## 2018-08-09 MED ORDER — FENTANYL CITRATE (PF) 100 MCG/2ML IJ SOLN
INTRAMUSCULAR | Status: DC | PRN
  Administered 2018-08-09: 100 ug via INTRAVENOUS

## 2018-08-09 MED ORDER — SODIUM CHLORIDE 0.9 % IV BOLUS
1000.0000 mL | Freq: Once | INTRAVENOUS | Status: AC
  Administered 2018-08-09: 1000 mL via INTRAVENOUS

## 2018-08-09 MED ORDER — ONDANSETRON HCL 4 MG/2ML IJ SOLN
INTRAMUSCULAR | Status: AC
  Filled 2018-08-09: qty 2

## 2018-08-09 MED ORDER — ONDANSETRON HCL 4 MG/2ML IJ SOLN: 4.0000 mg | Freq: Once | INTRAMUSCULAR | Status: DC | PRN

## 2018-08-09 MED ORDER — ROCURONIUM BROMIDE 100 MG/10ML IV SOLN
INTRAVENOUS | Status: DC | PRN
  Administered 2018-08-09: 5 mg via INTRAVENOUS

## 2018-08-09 SURGICAL SUPPLY — 1 items: IMMOBILIZER SHDR MD LX WHT (SOFTGOODS) ×3 IMPLANT

## 2018-08-09 NOTE — Anesthesia Postprocedure Evaluation (Signed)
Anesthesia Post Note  Patient: [place]  Procedure(s) Performed: CLOSED REDUCTION SHOULDER (Left )  Patient location during evaluation: PACU Anesthesia Type: General Level of consciousness: awake and alert Pain management: pain level controlled Vital Signs Assessment: post-procedure vital signs reviewed and stable Respiratory status: spontaneous breathing and respiratory function stable Cardiovascular status: stable Anesthetic complications: no     Last Vitals:  Vitals:   08/09/18 1800 08/09/18 2036  BP: (!) 123/96 122/74  Pulse: (!) 108 95  Resp: 20 14  Temp:  36.8 C  SpO2: 100% 100%    Last Pain:  Vitals:   08/09/18 2036  TempSrc:   PainSc: Asleep                 Obaloluwa Delatte K

## 2018-08-09 NOTE — Transfer of Care (Signed)
Immediate Anesthesia Transfer of Care Note  Patient: [place]  Procedure(s) Performed: CLOSED REDUCTION SHOULDER (Left )  Patient Location: PACU  Anesthesia Type:General  Level of Consciousness: sedated  Airway & Oxygen Therapy: Patient connected to face mask oxygen  Post-op Assessment: Post -op Vital signs reviewed and stable  Post vital signs: stable  Last Vitals:  Vitals Value Taken Time  BP 122/74 08/09/2018  8:36 PM  Temp 36.8 C 08/09/2018  8:36 PM  Pulse 95 08/09/2018  8:37 PM  Resp 20 08/09/2018  8:37 PM  SpO2 100 % 08/09/2018  8:37 PM  Vitals shown include unvalidated device data.  Last Pain:  Vitals:   08/09/18 2036  TempSrc:   PainSc: Asleep         Complications: No apparent anesthesia complications

## 2018-08-09 NOTE — Op Note (Signed)
08/09/2018  8:38 PM  Patient:   [name]  Pre-Op Diagnosis:   Irreducible anteroinferior glenohumeral dislocation, left shoulder.  Post-Op Diagnosis:   Same.  Procedure:   Closed reduction under general anesthesia of anterior inferior glenohumeral dislocation, left shoulder.  Surgeon:   Maryagnes AmosJ. Jeffrey Poggi, MD  Assistant:   None  Anesthesia:   GET  Findings:   As above.  Complications:   None  Fluids:   200 cc crystalloid  EBL:   0 cc  UOP:   None  TT:   None  Drains:   None  Closure:   None necessary  Brief Clinical Note:   The patient is a 22 year old male who apparently dislocated his left shoulder today when he suffered a seizure while at work at [employer]. X-rays in the emergency room demonstrated an anteroinferior dislocation of his left glenohumeral joint. Two attempts were made to try to reduce the shoulder in the emergency room by the ER physician without success.  The patient is brought to the emergency room for an attempted closed reduction versus possible open reduction of his left shoulder dislocation.  Procedure:   The patient was brought into the operating room and lain in the supine position.  After adequate general endotracheal intubation and anesthesia were obtained, the patient's shoulder was reduced using the technique of traction and countertraction.  Upon reduction, the shoulder exhibited smooth motion.  Post reduction images using the portable x-ray machine demonstrated anatomic reduction of his glenohumeral joint.  A large Hill-Sachs lesion was noted posterosuperiorly as expected.  The patient was placed into a shoulder immobilizer before he was awakened, extubated, and returned to the recovery room in satisfactory condition after tolerating the procedure well.

## 2018-08-09 NOTE — ED Triage Notes (Signed)
PT to ER via EMS from work after having a grand mal seizure.  Pt was post ictal on EMS arrival.  Pt had second grand mal seizure en route, EMS administered 2mg  IV Versed PTA

## 2018-08-09 NOTE — Discharge Instructions (Addendum)
Seizures may happen at any time. It is important to take certain precautions to maintain your safety.  Do not drive or operate heavy machinery until cleared by neurology.  Follow up with your doctor in 1-3 days.  If you were started on a seizure medication, take it as prescribed.  During a seizure, a person may injure himself or herself. Seizure precautions are guidelines that a person can follow in order to minimize injury during a seizure. For any activity, it is important to ask, "What would happen if I had a seizure while doing this?" Follow the below precautions.  Bathroom Safety  A person with seizures may want to shower instead of bathe to avoid accidental drowning. If falls occur during the patient's typical seizure, a person should use a shower seat, preferably one with a safety strap.   Use nonskid strips in your shower or tub.   Never use electrical equipment near water. This prevents accidental electrocution.   Consider changing glass in shower doors to shatterproof glass.  Secondary school teacher  If possible, cook when someone else is nearby.   Use the back burners of the stove to prevent accidental burns.   Use shatterproof containers as much as possible. For instance, sauces can be transferred from glass bottles to plastic containers for use.   Limit time that is required using knives or other sharp objects. If possible, buy foods that are already cut, or ask someone to help in meal preparation.   General Safety at Home  Do not smoke or light fires in the fireplace unless someone else is present.   Do not use space heaters that can be accidentally overturned.   When alone, avoid using step stools or ladders, and do not clean rooftop gutters.   Purchase power tools and motorized Risk manager which have a safety switch that will stop the machine if you release the handle (a 'dead man's' switch).   Driving and Transportation  DO NOT DRIVE UNTIL YOU ARE CLEARED BY A  NEUROLOGIST and/or you have permission to drive from your state's Department of Motor Vehicles  [facility]). Each state has different laws. Please refer to the following link on the Epilepsy Foundation of America's website for more information: http://www.epilepsyfoundation.org/answerplace/Social/driving/drivingu.cfm   If you ride a bicycle, wear a helmet and any other necessary protective gear.   When taking public transportation like the bus or subway, stay clear of the platform edge.   Outdoor Counsellor is okay, but does present certain risks. Never swim alone, and tell friends what to do if you have a seizure while swimming.   Wear appropriate protective equipment.   Ski with a friend. If a seizure occurs, your friend can seek help, if needed. He or she can also help to get you out of the cold. Consider using a safety hook or belt while riding the ski lift.    Orthopedic discharge instructions: Keep shoulder immobilizer on at all times except may remove for bathing purposes.  Apply ice frequently to shoulder. Take ibuprofen 600-800 mg TID with meals for 7-10 days, then as necessary. Take hydrocodone as prescribed or Tylenol when needed.  Follow-up in 10-14 days.   How to Use a Shoulder Immobilizer A shoulder immobilizer is a device that you may have to wear after a shoulder injury or surgery. This device keeps your arm from moving. This prevents additional pain or injury. It also supports your arm next to your body as your shoulder heals. You may  need to wear a shoulder immobilizer to treat a broken bone (fracture) in your shoulder. You may also need to wear one if you have an injury that moves your shoulder out of position (dislocation). There are different types of shoulder immobilizers. The one that you get depends on your injury. What are the risks? Wearing a shoulder immobilizer in the wrong way can let your injured shoulder move around too much. This may delay  healing and make your pain and swelling worse. How to use your shoulder immobilizer  The part of the immobilizer that goes around your neck (sling) should support your upper arm, with your elbow bent and your lower arm and hand across your chest.  Make sure that your elbow: ? Is snug against the back pocket of the sling. ? Does not move away from your body.  The strap of the immobilizer should go over your shoulder and support your arm and hand. Your hand should be slightly higher than your elbow. It should not hang loosely over the edge of the sling.  If the long strap has a pad, place it where it is most comfortable on your neck.  Carefully follow your health care provider's instructions for wearing your shoulder immobilizer. Your health care provider may want you to: ? Loosen your immobilizer to straighten your elbow and move your wrist and fingers. You may have to do this several times each day. Ask your health care provider when you should do this and how often. ? Remove your immobilizer once every day to shower, but limit the movement in your injured arm. Before putting the immobilizer back on, use a towel to dry the area under your arm completely. ? Remove your immobilizer to do shoulder exercises at home as directed by your health care provider. ? Wear your immobilizer while you sleep. You may sleep more comfortably if you have your upper body raised on pillows. Contact a health care provider if:  Your immobilizer is not supporting your arm properly.  Your immobilizer gets damaged.  You have worsening pain or swelling in your shoulder, arm, or hand.  Your shoulder, arm, or hand changes color or temperature.  You lose feeling in your shoulder, arm, or hand. This information is not intended to replace advice given to you by your health care provider. Make sure you discuss any questions you have with your health care provider. Document Released: 10/05/2004 Document Revised:  02/03/2016 Document Reviewed: 08/05/2014 Elsevier Interactive Patient Education  2018 Elsevier Inc.   AMBULATORY SURGERY  DISCHARGE INSTRUCTIONS   1) The drugs that you were given will stay in your system until tomorrow so for the next 24 hours you should not:  A) Drive an automobile B) Make any legal decisions C) Drink any alcoholic beverage   2) You may resume regular meals tomorrow.  Today it is better to start with liquids and gradually work up to solid foods.  You may eat anything you prefer, but it is better to start with liquids, then soup and crackers, and gradually work up to solid foods.   3) Please notify your doctor immediately if you have any unusual bleeding, trouble breathing, redness and pain at the surgery site, drainage, fever, or pain not relieved by medication.    4) Additional Instructions:    Please contact your physician with any problems or Same Day Surgery at [phone], Monday through Friday 6 am to 4 pm, or [place] at [facility] Main number at [phone].

## 2018-08-09 NOTE — Anesthesia Post-op Follow-up Note (Signed)
Anesthesia QCDR form completed.        
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
## 2018-08-09 NOTE — Anesthesia Procedure Notes (Signed)
Procedure Name: Intubation Date/Time: 08/09/2018 8:14 PM Performed by: Irving BurtonBachich, Rosaleen Mazer, CRNA Pre-anesthesia Checklist: Patient identified, Emergency Drugs available, Suction available and Patient being monitored Patient Re-evaluated:Patient Re-evaluated prior to induction Oxygen Delivery Method: Circle system utilized Preoxygenation: Pre-oxygenation with 100% oxygen Induction Type: IV induction Ventilation: Mask ventilation without difficulty Laryngoscope Size: McGraph and 4 Grade View: Grade I Tube size: 7.0 mm Number of attempts: 2 Airway Equipment and Method: Stylet and Video-laryngoscopy Difficulty Due To: Difficult Airway- due to anterior larynx
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
## 2018-08-09 NOTE — Anesthesia Preprocedure Evaluation (Signed)
Anesthesia Evaluation  Patient identified by MRN, date of birth, ID band Patient awake    Reviewed: Allergy & Precautions, NPO status , Patient's Chart, lab work & pertinent test results  History of Anesthesia Complications Negative for: history of anesthetic complications  Airway Mallampati: II       Dental   Pulmonary neg sleep apnea, neg COPD,           Cardiovascular (-) hypertension(-) Past MI and (-) CHF (-) dysrhythmias (-) Valvular Problems/Murmurs     Neuro/Psych Seizures - (2 today, 1 previous, loaded with keppra),     GI/Hepatic Neg liver ROS, neg GERD  ,  Endo/Other  neg diabetes  Renal/GU negative Renal ROS     Musculoskeletal   Abdominal   Peds  Hematology   Anesthesia Other Findings   Reproductive/Obstetrics                             Anesthesia Physical Anesthesia Plan  ASA: II and emergent  Anesthesia Plan: General   Post-op Pain Management:    Induction: Intravenous  PONV Risk Score and Plan: 2 and Dexamethasone and Ondansetron  Airway Management Planned: Oral ETT  Additional Equipment:   Intra-op Plan:   Post-operative Plan:   Informed Consent: I have reviewed the patients History and Physical, chart, labs and discussed the procedure including the risks, benefits and alternatives for the proposed anesthesia with the patient or authorized representative who has indicated his/her understanding and acceptance.     Plan Discussed with:   Anesthesia Plan Comments:         Anesthesia Quick Evaluation

## 2018-08-09 NOTE — OR Nursing (Signed)
Martin Gonzalez had surgery on 08/09/18.  Patient to call doctor's office Monday 08/12/18 to make return appointment in 10 to 14 days.  Patient will ask at that time when he may return to work.  Please excuse from work until this time.     [facility]([place]) surgical unit

## 2018-08-09 NOTE — ED Notes (Signed)
Called from CT, pt unable to cooperate with scans at this time.  MD aware and states to bring pt back to room at this time and we will wait on scans.

## 2018-08-09 NOTE — Transfer of Care (Deleted)
Immediate Anesthesia Transfer of Care Note  Patient: Martin Gonzalez  Procedure(s) Performed: CLOSED REDUCTION SHOULDER (Left )  Patient Location: PACU  Anesthesia Type:General  Level of Consciousness: sedated  Airway & Oxygen Therapy: Patient connected to face mask oxygen  Post-op Assessment: Post -op Vital signs reviewed and stable  Post vital signs: stable  Last Vitals:  Vitals Value Taken Time  BP    Temp    Pulse    Resp    SpO2      Last Pain:  Vitals:   08/09/18 1313  TempSrc: Axillary         Complications: No apparent anesthesia complications

## 2018-08-09 NOTE — ED Notes (Signed)
Report to OR, pt to be transported by OR shortly.

## 2018-08-10 ENCOUNTER — Encounter: Payer: Self-pay | Admitting: Surgery

## 2018-08-12 ENCOUNTER — Telehealth: Payer: Self-pay | Admitting: *Deleted

## 2018-08-12 ENCOUNTER — Ambulatory Visit: Payer: 59 | Admitting: Family Medicine

## 2018-08-12 DIAGNOSIS — R569 Unspecified convulsions: Secondary | ICD-10-CM | POA: Diagnosis not present

## 2018-08-12 DIAGNOSIS — R51 Headache: Secondary | ICD-10-CM | POA: Diagnosis not present

## 2018-08-12 NOTE — Telephone Encounter (Signed)
Patient has an appointment tomorrow (08/13/18) which is within 48 hours. Okay to bill for TCM.

## 2018-08-13 ENCOUNTER — Ambulatory Visit: Payer: 59 | Admitting: Family Medicine

## 2018-08-13 ENCOUNTER — Encounter: Payer: Self-pay | Admitting: Family Medicine

## 2018-08-13 VITALS — BP 118/78 | HR 70 | Temp 98.2°F | Ht 69.0 in | Wt 153.5 lb

## 2018-08-13 DIAGNOSIS — B9789 Other viral agents as the cause of diseases classified elsewhere: Secondary | ICD-10-CM

## 2018-08-13 DIAGNOSIS — G40909 Epilepsy, unspecified, not intractable, without status epilepticus: Secondary | ICD-10-CM

## 2018-08-13 DIAGNOSIS — R519 Headache, unspecified: Secondary | ICD-10-CM

## 2018-08-13 DIAGNOSIS — R51 Headache: Secondary | ICD-10-CM | POA: Diagnosis not present

## 2018-08-13 DIAGNOSIS — R779 Abnormality of plasma protein, unspecified: Secondary | ICD-10-CM | POA: Insufficient documentation

## 2018-08-13 DIAGNOSIS — M24412 Recurrent dislocation, left shoulder: Secondary | ICD-10-CM

## 2018-08-13 DIAGNOSIS — E8809 Other disorders of plasma-protein metabolism, not elsewhere classified: Secondary | ICD-10-CM | POA: Diagnosis not present

## 2018-08-13 DIAGNOSIS — J069 Acute upper respiratory infection, unspecified: Secondary | ICD-10-CM | POA: Diagnosis not present

## 2018-08-13 NOTE — Progress Notes (Signed)
BP 118/78 (BP Location: Left Arm, Patient Position: Sitting, Cuff Size: Normal)   Pulse 70   Temp 98.2 F (36.8 C) (Oral)   Ht 5\' 9"  (1.753 m)   Wt 153 lb 8 oz (69.6 kg)   SpO2 98%   BMI 22.67 kg/m    CC: ER eval for recurrent seizure Subjective:    Patient ID: Martin Gonzalez, male    DOB: 18-Jun-1996, 22 y.o.   MRN: [id number]  HPI: Martin Gonzalez is a 22 y.o. male presenting on 08/13/2018 for Hospitalization Follow-up   Here with mom Rosa.   Repeat seizure episode with resultant recurrent anterio-inferior L GH shoulder dislocation (initial one occurred 05/2018), needed reduction under general anesthesia in OR. He was started on keppra 500mg  bid at ER.  Records reviewed. CT head was unrevealing.  He has established with Dr Malvin Johns at [facility] - saw in f/u yesterday. He had had EEG and brain MRI. Planning 3 hr EEG. Ongoing headaches after latest seizure. Started on nortriptyline 10mg  at bedtime. Injured R forehead after fall.   CT OF THE UPPER LEFT EXTREMITY WITHOUT CONTRAST IMPRESSION: 1. Anterior dislocation of the left humeral head with a deep Hill-Sachs impaction fracture with adjacent bone avulsions of the articular surface as described. 2. Extensive fluid in the subacromial/subdeltoid bursae. 3. Intact bony rim of the glenoid Electronically Signed   By: Francene Boyers M.D.   On: 08/09/2018 16:08  Noticing worsening headaches over the last 1-2 wks.  He took OTC remedies for URI last week.  No vaping.  No smoking.  He had been drinking 1-5 beers QOD.  Tolerating keppra well without mood changes.   Has f/u with neuro and ortho  URI sxs over the last week - congestion, productive cough, ST. Ongoing headache. Feels feverish  No ear or tooth pain, dyspnea or wheezing.  Has tried OTC cold medication No h/o asthma. No sick contacts at home. Non smoker.   Relevant past medical, surgical, family and social history reviewed and updated as indicated. Interim medical  history since our last visit reviewed. Allergies and medications reviewed and updated. Outpatient Medications Prior to Visit  Medication Sig Dispense Refill  . HYDROcodone-acetaminophen (NORCO/VICODIN) 5-325 MG tablet Take 1 tablet by mouth every 4 (four) hours as needed for moderate pain. 30 tablet 0  . ibuprofen (ADVIL,MOTRIN) 200 MG tablet Take 200 mg by mouth every 6 (six) hours as needed.    . levETIRAcetam (KEPPRA) 500 MG tablet Take 1 tablet (500 mg total) by mouth 2 (two) times daily. 60 tablet 1  . Multiple Vitamins-Minerals (EMERGEN-C IMMUNE PO) Take 1 packet by mouth daily.    . nortriptyline (PAMELOR) 10 MG capsule Take 2 capsules (20 mg total) by mouth at bedtime.     No facility-administered medications prior to visit.      Per HPI unless specifically indicated in ROS section below Review of Systems     Objective:    BP 118/78 (BP Location: Left Arm, Patient Position: Sitting, Cuff Size: Normal)   Pulse 70   Temp 98.2 F (36.8 C) (Oral)   Ht 5\' 9"  (1.753 m)   Wt 153 lb 8 oz (69.6 kg)   SpO2 98%   BMI 22.67 kg/m   Wt Readings from Last 3 Encounters:  08/13/18 153 lb 8 oz (69.6 kg)  08/09/18 160 lb (72.6 kg)  05/20/18 159 lb 8 oz (72.3 kg)    Physical Exam  Constitutional: He appears well-developed and well-nourished. No distress.  HENT:  Head: Normocephalic and atraumatic.  Right Ear: Hearing, tympanic membrane, external ear and ear canal normal.  Left Ear: Hearing, tympanic membrane, external ear and ear canal normal.  Nose: Mucosal edema present. No rhinorrhea.  Mouth/Throat: Uvula is midline, oropharynx is clear and moist and mucous membranes are normal. No oropharyngeal exudate, posterior oropharyngeal edema, posterior oropharyngeal erythema or tonsillar abscesses.  Cerumen in bilateral canals  Eyes: Pupils are equal, round, and reactive to light. Conjunctivae and EOM are normal. No scleral icterus.  Neck: Normal range of motion. Neck supple.    Cardiovascular: Normal rate, regular rhythm, normal heart sounds and intact distal pulses.  No murmur heard. Pulmonary/Chest: Effort normal and breath sounds normal. No respiratory distress. He has no wheezes. He has no rales.  Musculoskeletal:  L shoulder in shoulder immobilizer  Lymphadenopathy:    He has no cervical adenopathy.  Skin: Skin is warm and dry. No rash noted.  Nursing note and vitals reviewed.     Assessment & Plan:  UDS positive for benzos - likely from benzo use during EMS trip where he had recurrent seizure in ambulance.  Problem List Items Addressed This Visit    Viral URI with cough    Anticipate viral given short duration. Supportive care reviewed including OTC remedies. Update us if red flags develop.      Seizure disorder (HCC) - Primary    Recurrent seizure. Has been started on keppra 500mg  bid.  Aware to stop alcohol use at this time.  For ongoing headache, has been started on nortriptyline nightly.  Will await upcoming 3hr EEG.  Aware of starting new 6 month seizure free period prior to driving.        Recurrent shoulder dislocation, left    Recurrent with shoulder fracture s/p relocation under GETA in OR. Now in shoulder immobilizer - has f/u planned with ortho next week. Appreciate ortho care.       Headache    ?seizure related. Started on nortriptyline. Will await effect.       Relevant Medications   nortriptyline (PAMELOR) 10 MG capsule   Elevated blood protein    Noted during ER visit for reurrent seizure  Will recheck next labwork.           No orders of the defined types were placed in this encounter.  No orders of the defined types were placed in this encounter.   Follow up plan: No follow-ups on file.  Eustaquio BoydenJavier Romilda Proby, MD

## 2018-08-13 NOTE — Patient Instructions (Addendum)
No more alcohol  Continue keppra twice daily and nortriptyline at night time. We will await 3 hour EEG.  For cough:  You have a viral upper respiratory infection. Plain mucinex with plenty of water to help mobilize mucous. May use delsym or robitussin over the counter cough syrup  Push fluids and plenty of rest. Let us know if fever >101, worsening productive cough or not improving with time.

## 2018-08-13 NOTE — Assessment & Plan Note (Addendum)
Recurrent with shoulder fracture s/p relocation under GETA in OR. Now in shoulder immobilizer - has f/u planned with ortho next week. Appreciate ortho care.

## 2018-08-13 NOTE — Assessment & Plan Note (Addendum)
Recurrent seizure. Has been started on keppra 500mg  bid.  Aware to stop alcohol use at this time.  For ongoing headache, has been started on nortriptyline nightly.  Will await upcoming 3hr EEG.  Aware of starting new 6 month seizure free period prior to driving.

## 2018-08-13 NOTE — Assessment & Plan Note (Addendum)
Anticipate viral given short duration. Supportive care reviewed including OTC remedies. Update us if red flags develop.

## 2018-08-13 NOTE — Assessment & Plan Note (Signed)
Noted during ER visit for reurrent seizure  Will recheck next labwork.

## 2018-08-13 NOTE — Assessment & Plan Note (Signed)
?  seizure related. Started on nortriptyline. Will await effect.

## 2018-08-19 DIAGNOSIS — H5203 Hypermetropia, bilateral: Secondary | ICD-10-CM | POA: Diagnosis not present

## 2018-09-05 MED FILL — levETIRAcetam 500 MG TABS: 500 | 30 days supply | Qty: 60 | Fill #0

## 2018-10-01 MED FILL — levETIRAcetam 500 MG TABS: 500 | 30 days supply | Qty: 60 | Fill #0

## 2018-10-04 DIAGNOSIS — R569 Unspecified convulsions: Secondary | ICD-10-CM | POA: Diagnosis not present

## 2018-11-01 MED FILL — levETIRAcetam 500 MG TABS: 500 | 30 days supply | Qty: 60 | Fill #1

## 2018-11-04 DIAGNOSIS — R569 Unspecified convulsions: Secondary | ICD-10-CM | POA: Diagnosis not present

## 2018-11-04 DIAGNOSIS — R51 Headache: Secondary | ICD-10-CM | POA: Diagnosis not present

## 2018-11-26 MED FILL — levETIRAcetam 500 MG TABS: 500 | 30 days supply | Qty: 60 | Fill #2

## 2018-12-04 ENCOUNTER — Emergency Department: Payer: 59

## 2018-12-04 ENCOUNTER — Other Ambulatory Visit: Payer: Self-pay

## 2018-12-04 DIAGNOSIS — Y999 Unspecified external cause status: Secondary | ICD-10-CM | POA: Insufficient documentation

## 2018-12-04 DIAGNOSIS — Y929 Unspecified place or not applicable: Secondary | ICD-10-CM | POA: Insufficient documentation

## 2018-12-04 DIAGNOSIS — Z79899 Other long term (current) drug therapy: Secondary | ICD-10-CM | POA: Insufficient documentation

## 2018-12-04 DIAGNOSIS — X509XXA Other and unspecified overexertion or strenuous movements or postures, initial encounter: Secondary | ICD-10-CM | POA: Insufficient documentation

## 2018-12-04 DIAGNOSIS — S43015A Anterior dislocation of left humerus, initial encounter: Secondary | ICD-10-CM | POA: Diagnosis not present

## 2018-12-04 DIAGNOSIS — S4992XA Unspecified injury of left shoulder and upper arm, initial encounter: Secondary | ICD-10-CM | POA: Diagnosis present

## 2018-12-04 DIAGNOSIS — S43005A Unspecified dislocation of left shoulder joint, initial encounter: Secondary | ICD-10-CM | POA: Diagnosis not present

## 2018-12-04 DIAGNOSIS — Y9389 Activity, other specified: Secondary | ICD-10-CM | POA: Diagnosis not present

## 2018-12-04 DIAGNOSIS — S43001A Unspecified subluxation of right shoulder joint, initial encounter: Secondary | ICD-10-CM | POA: Diagnosis not present

## 2018-12-04 NOTE — ED Triage Notes (Signed)
Pt states he was reaching back tonight and felt "pop" to left shoulder. PT hx of multiple shoulder dislocations to left arm.

## 2018-12-05 ENCOUNTER — Emergency Department: Payer: 59

## 2018-12-05 ENCOUNTER — Emergency Department
Admission: EM | Admit: 2018-12-05 | Discharge: 2018-12-05 | Disposition: A | Discharge status: Home / Self Care | Payer: 59 | Attending: Emergency Medicine | Admitting: Emergency Medicine

## 2018-12-05 DIAGNOSIS — S43005A Unspecified dislocation of left shoulder joint, initial encounter: Secondary | ICD-10-CM

## 2018-12-05 DIAGNOSIS — S43015A Anterior dislocation of left humerus, initial encounter: Secondary | ICD-10-CM | POA: Diagnosis not present

## 2018-12-05 DIAGNOSIS — Z79899 Other long term (current) drug therapy: Secondary | ICD-10-CM | POA: Diagnosis not present

## 2018-12-05 MED ORDER — LORAZEPAM 2 MG/ML IJ SOLN
1.0000 mg | Freq: Once | INTRAMUSCULAR | Status: AC
  Administered 2018-12-05: 1 mg via INTRAVENOUS
  Filled 2018-12-05: qty 1
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
## 2018-12-05 NOTE — Discharge Instructions (Signed)
Wear shoulder immobilizer until seen by the orthopedist.  Return to the ER for worsening symptoms, persistent vomiting, difficulty breathing or other concerns.

## 2018-12-19 MED FILL — levETIRAcetam 500 MG TABS: 500 | 90 days supply | Qty: 180 | Fill #0

## 2019-02-07 ENCOUNTER — Emergency Department: Payer: 59

## 2019-02-07 ENCOUNTER — Other Ambulatory Visit: Payer: Self-pay

## 2019-02-07 ENCOUNTER — Emergency Department
Admission: EM | Admit: 2019-02-07 | Discharge: 2019-02-08 | Disposition: A | Discharge status: Home / Self Care | Payer: 59 | Attending: Emergency Medicine | Admitting: Emergency Medicine

## 2019-02-07 ENCOUNTER — Encounter: Payer: Self-pay | Admitting: Emergency Medicine

## 2019-02-07 DIAGNOSIS — X500XXA Overexertion from strenuous movement or load, initial encounter: Secondary | ICD-10-CM | POA: Insufficient documentation

## 2019-02-07 DIAGNOSIS — S43005D Unspecified dislocation of left shoulder joint, subsequent encounter: Secondary | ICD-10-CM | POA: Diagnosis not present

## 2019-02-07 DIAGNOSIS — Y92009 Unspecified place in unspecified non-institutional (private) residence as the place of occurrence of the external cause: Secondary | ICD-10-CM | POA: Insufficient documentation

## 2019-02-07 DIAGNOSIS — Y9383 Activity, rough housing and horseplay: Secondary | ICD-10-CM | POA: Insufficient documentation

## 2019-02-07 DIAGNOSIS — Y998 Other external cause status: Secondary | ICD-10-CM | POA: Diagnosis not present

## 2019-02-07 DIAGNOSIS — S43015A Anterior dislocation of left humerus, initial encounter: Secondary | ICD-10-CM | POA: Diagnosis not present

## 2019-02-07 DIAGNOSIS — S43005A Unspecified dislocation of left shoulder joint, initial encounter: Secondary | ICD-10-CM | POA: Diagnosis not present

## 2019-02-07 DIAGNOSIS — S4992XA Unspecified injury of left shoulder and upper arm, initial encounter: Secondary | ICD-10-CM | POA: Diagnosis present
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
## 2019-02-07 NOTE — ED Triage Notes (Signed)
Patient states that he dislocated his left shoulder about 20 minutes ago. Patient states that he has a history of the same.

## 2019-02-07 NOTE — ED Notes (Signed)
Patient transported to X-ray 

## 2019-02-13 DIAGNOSIS — M24411 Recurrent dislocation, right shoulder: Secondary | ICD-10-CM | POA: Diagnosis not present

## 2019-02-13 DIAGNOSIS — M25511 Pain in right shoulder: Secondary | ICD-10-CM | POA: Diagnosis not present

## 2019-02-13 DIAGNOSIS — M24412 Recurrent dislocation, left shoulder: Secondary | ICD-10-CM | POA: Diagnosis not present

## 2019-02-13 DIAGNOSIS — M25611 Stiffness of right shoulder, not elsewhere classified: Secondary | ICD-10-CM | POA: Diagnosis not present

## 2019-02-13 DIAGNOSIS — M25512 Pain in left shoulder: Secondary | ICD-10-CM | POA: Diagnosis not present

## 2019-02-17 DIAGNOSIS — M25512 Pain in left shoulder: Secondary | ICD-10-CM | POA: Diagnosis not present

## 2019-02-17 DIAGNOSIS — M24412 Recurrent dislocation, left shoulder: Secondary | ICD-10-CM | POA: Diagnosis not present

## 2019-02-24 DIAGNOSIS — M25512 Pain in left shoulder: Secondary | ICD-10-CM | POA: Diagnosis not present

## 2019-02-24 DIAGNOSIS — M24412 Recurrent dislocation, left shoulder: Secondary | ICD-10-CM | POA: Diagnosis not present

## 2019-03-03 DIAGNOSIS — M25512 Pain in left shoulder: Secondary | ICD-10-CM | POA: Diagnosis not present

## 2019-03-03 DIAGNOSIS — M24412 Recurrent dislocation, left shoulder: Secondary | ICD-10-CM | POA: Diagnosis not present

## 2019-03-10 DIAGNOSIS — M24412 Recurrent dislocation, left shoulder: Secondary | ICD-10-CM | POA: Diagnosis not present

## 2019-03-10 DIAGNOSIS — M25512 Pain in left shoulder: Secondary | ICD-10-CM | POA: Diagnosis not present

## 2019-03-17 MED FILL — levETIRAcetam 500 MG TABS: 500 | 90 days supply | Qty: 180 | Fill #1

## 2019-04-16 ENCOUNTER — Ambulatory Visit (INDEPENDENT_AMBULATORY_CARE_PROVIDER_SITE_OTHER)
Admission: RE | Admit: 2019-04-16 | Discharge: 2019-04-16 | Disposition: A | Discharge status: Home / Self Care | Payer: 59 | Source: Ambulatory Visit | Attending: Family Medicine | Admitting: Family Medicine

## 2019-04-16 ENCOUNTER — Emergency Department
Admission: EM | Admit: 2019-04-16 | Discharge: 2019-04-16 | Disposition: A | Discharge status: Home / Self Care | Payer: 59 | Attending: Emergency Medicine | Admitting: Emergency Medicine

## 2019-04-16 ENCOUNTER — Ambulatory Visit (INDEPENDENT_AMBULATORY_CARE_PROVIDER_SITE_OTHER): Payer: 59 | Admitting: Family Medicine

## 2019-04-16 ENCOUNTER — Other Ambulatory Visit: Payer: Self-pay

## 2019-04-16 ENCOUNTER — Emergency Department: Payer: 59

## 2019-04-16 ENCOUNTER — Encounter: Payer: Self-pay | Admitting: Family Medicine

## 2019-04-16 ENCOUNTER — Encounter: Payer: Self-pay | Admitting: Emergency Medicine

## 2019-04-16 VITALS — HR 83 | Temp 98.3°F | Ht 69.0 in | Wt 154.5 lb

## 2019-04-16 DIAGNOSIS — Z79899 Other long term (current) drug therapy: Secondary | ICD-10-CM | POA: Diagnosis not present

## 2019-04-16 DIAGNOSIS — M24412 Recurrent dislocation, left shoulder: Secondary | ICD-10-CM | POA: Insufficient documentation

## 2019-04-16 DIAGNOSIS — S43005A Unspecified dislocation of left shoulder joint, initial encounter: Secondary | ICD-10-CM | POA: Diagnosis not present

## 2019-04-16 DIAGNOSIS — S43005D Unspecified dislocation of left shoulder joint, subsequent encounter: Secondary | ICD-10-CM | POA: Diagnosis not present

## 2019-04-16 DIAGNOSIS — S43392A Subluxation of other parts of left shoulder girdle, initial encounter: Secondary | ICD-10-CM | POA: Diagnosis not present

## 2019-04-16 DIAGNOSIS — S43015A Anterior dislocation of left humerus, initial encounter: Secondary | ICD-10-CM | POA: Diagnosis not present

## 2019-04-16 MED ORDER — DIAZEPAM 5 MG PO TABS
10.0000 mg | ORAL_TABLET | Freq: Once | ORAL | Status: AC
  Administered 2019-04-16: 10 mg via ORAL
  Filled 2019-04-16: qty 2

## 2019-04-16 MED ORDER — OXYCODONE-ACETAMINOPHEN 5-325 MG PO TABS
2.0000 | ORAL_TABLET | Freq: Once | ORAL | Status: AC
  Administered 2019-04-16: 2 via ORAL
  Filled 2019-04-16: qty 2
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
## 2019-04-16 NOTE — ED Triage Notes (Signed)
Left shoulder dislocation. Hx of frequent dislocation. Can normally get in without sedation, but waited for hr at [place] and they could not get in.  Reached for phone this AM and it popped out.
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
## 2019-04-16 NOTE — ED Notes (Signed)
Left shoulder with noted abnormality and physician is aware

## 2019-05-12 DIAGNOSIS — M24412 Recurrent dislocation, left shoulder: Secondary | ICD-10-CM | POA: Diagnosis not present

## 2019-05-15 ENCOUNTER — Other Ambulatory Visit: Payer: Self-pay | Admitting: Surgery

## 2019-05-15 DIAGNOSIS — M24412 Recurrent dislocation, left shoulder: Secondary | ICD-10-CM

## 2019-06-02 ENCOUNTER — Ambulatory Visit
Admission: RE | Admit: 2019-06-02 | Discharge: 2019-06-02 | Disposition: A | Discharge status: Home / Self Care | Payer: 59 | Source: Ambulatory Visit | Attending: Surgery | Admitting: Surgery

## 2019-06-02 ENCOUNTER — Other Ambulatory Visit: Payer: Self-pay

## 2019-06-02 DIAGNOSIS — S43015A Anterior dislocation of left humerus, initial encounter: Secondary | ICD-10-CM | POA: Insufficient documentation

## 2019-06-02 DIAGNOSIS — M24412 Recurrent dislocation, left shoulder: Secondary | ICD-10-CM | POA: Insufficient documentation

## 2019-06-02 DIAGNOSIS — S43005A Unspecified dislocation of left shoulder joint, initial encounter: Secondary | ICD-10-CM | POA: Diagnosis not present

## 2019-06-02 MED ORDER — IOHEXOL 180 MG/ML  SOLN
15.0000 mL | Freq: Once | INTRAMUSCULAR | Status: AC | PRN
  Administered 2019-06-02: 15 mL

## 2019-06-02 MED ORDER — LIDOCAINE HCL (PF) 1 % IJ SOLN
10.0000 mL | Freq: Once | INTRAMUSCULAR | Status: AC
  Administered 2019-06-02: 10 mL
  Filled 2019-06-02: qty 10

## 2019-06-02 MED ORDER — GADOBUTROL 1 MMOL/ML IV SOLN
0.0500 mL | Freq: Once | INTRAVENOUS | Status: AC | PRN
  Administered 2019-06-02: 0.05 mL

## 2019-06-09 DIAGNOSIS — M24412 Recurrent dislocation, left shoulder: Secondary | ICD-10-CM | POA: Diagnosis not present

## 2019-06-14 MED FILL — levETIRAcetam 500 MG TABS: 500 | 90 days supply | Qty: 180 | Fill #2

## 2019-06-26 ENCOUNTER — Other Ambulatory Visit: Payer: Self-pay

## 2019-06-26 ENCOUNTER — Ambulatory Visit: Payer: 59

## 2019-06-26 DIAGNOSIS — Z20822 Contact with and (suspected) exposure to covid-19: Secondary | ICD-10-CM

## 2019-06-26 DIAGNOSIS — Z20828 Contact with and (suspected) exposure to other viral communicable diseases: Secondary | ICD-10-CM | POA: Diagnosis not present

## 2019-06-28 LAB — NOVEL CORONAVIRUS, NAA: SARS-CoV-2, NAA: NOT DETECTED

## 2019-07-11 ENCOUNTER — Other Ambulatory Visit: Payer: Self-pay | Admitting: Surgery

## 2019-07-17 ENCOUNTER — Ambulatory Visit (INDEPENDENT_AMBULATORY_CARE_PROVIDER_SITE_OTHER): Payer: 59

## 2019-07-17 DIAGNOSIS — Z23 Encounter for immunization: Secondary | ICD-10-CM

## 2019-07-25 ENCOUNTER — Inpatient Hospital Stay: Admission: RE | Admit: 2019-07-25 | Payer: 59 | Source: Ambulatory Visit

## 2019-07-28 ENCOUNTER — Encounter
Admission: RE | Admit: 2019-07-28 | Discharge: 2019-07-28 | Disposition: A | Discharge status: Home / Self Care | Payer: 59 | Source: Ambulatory Visit | Attending: Surgery | Admitting: Surgery

## 2019-07-28 ENCOUNTER — Other Ambulatory Visit: Payer: Self-pay

## 2019-07-28 DIAGNOSIS — Z20828 Contact with and (suspected) exposure to other viral communicable diseases: Secondary | ICD-10-CM | POA: Insufficient documentation

## 2019-07-28 DIAGNOSIS — Z01812 Encounter for preprocedural laboratory examination: Secondary | ICD-10-CM | POA: Insufficient documentation

## 2019-07-28 LAB — SARS CORONAVIRUS 2 (TAT 6-24 HRS): SARS Coronavirus 2: NEGATIVE

## 2019-07-30 NOTE — Patient Instructions (Signed)
Your procedure is scheduled on: 07-31-19 Report to Same Day Surgery 2nd floor medical mall [facility] Entrance-take elevator on left to 2nd floor.  Check in with surgery information desk.) To find out your arrival time please call [phone] between 1PM - 3PM on 07-30-19  Remember: Instructions that are not followed completely may result in serious medical risk, up to and including death, or upon the discretion of your surgeon and anesthesiologist your surgery may need to be rescheduled.    _x___ 1. Do not eat food after midnight the night before your procedure. You may drink clear liquids up to 2 hours before you are scheduled to arrive at the hospital for your procedure.  Do not drink clear liquids within 2 hours of your scheduled arrival to the hospital.  Clear liquids include  --Water or Apple juice without pulp  --Clear carbohydrate beverage such as ClearFast or Gatorade  --Black Coffee or Clear Tea (No milk, no creamers, do not add anything to the coffee or Tea   ____Ensure clear carbohydrate drink on the way to the hospital for bariatric patients  _X___Ensure clear carbohydrate drink 3 hours before surgery.   No gum chewing or hard candies.     __x__ 2. No Alcohol for 24 hours before or after surgery.   __x__3. No Smoking or e-cigarettes for 24 prior to surgery.  Do not use any chewable tobacco products for at least 6 hour prior to surgery   ____  4. Bring all medications with you on the day of surgery if instructed.    __x__ 5. Notify your doctor if there is any change in your medical condition     (cold, fever, infections).    x___6. On the morning of surgery brush your teeth with toothpaste and water.  You may rinse your mouth with mouth wash if you wish.  Do not swallow any toothpaste or mouthwash.   Do not wear jewelry, make-up, hairpins, clips or nail polish.  Do not wear lotions, powders, or perfumes. You may wear deodorant.  Do not shave 48 hours prior to  surgery. Men may shave face and neck.  Do not bring valuables to the hospital.    [facility] is not responsible for any belongings or valuables.               Contacts, dentures or bridgework may not be worn into surgery.  Leave your suitcase in the car. After surgery it may be brought to your room.  For patients admitted to the hospital, discharge time is determined by your  treatment team.  _  Patients discharged the day of surgery will not be allowed to drive home.  You will need someone to drive you home and stay with you the night of your procedure.    Please read over the following fact sheets that you were given:   [facility] Preparing for Surgery   _x___ Take anti-hypertensive listed below, cardiac, seizure, asthma, anti-reflux and psychiatric medicines. These include:  1. KEPPRA  2.  3.  4.  5.  6.  ____Fleets enema or Magnesium Citrate as directed.   _x___ Use CHG Soap or sage wipes as directed on instruction sheet   ____ Use inhalers on the day of surgery and bring to hospital day of surgery  ____ Stop Metformin and Janumet 2 days prior to surgery.    ____ Take 1/2 of usual insulin dose the night before surgery and none on the morning surgery.   ____  Follow recommendations from Cardiologist, Pulmonologist or PCP regarding stopping Aspirin, Coumadin, Plavix ,Eliquis, Effient, or Pradaxa, and Pletal.  X____Stop Anti-inflammatories such as Advil, Aleve, Ibuprofen, Motrin, Naproxen, Naprosyn, Goodies powders or aspirin products NOW-OK to take Tylenol    ____ Stop supplements until after surgery.     ____ Bring C-Pap to the hospital.

## 2019-07-31 ENCOUNTER — Ambulatory Visit
Admission: RE | Admit: 2019-07-31 | Discharge: 2019-07-31 | Disposition: A | Discharge status: Home / Self Care | Payer: 59 | Attending: Surgery | Admitting: Surgery

## 2019-07-31 ENCOUNTER — Encounter
Admission: RE | Disposition: A | Discharge status: Home / Self Care | Payer: Self-pay | Source: Home / Self Care | Attending: Surgery

## 2019-07-31 ENCOUNTER — Ambulatory Visit: Payer: 59 | Admitting: Anesthesiology

## 2019-07-31 ENCOUNTER — Other Ambulatory Visit: Payer: Self-pay

## 2019-07-31 ENCOUNTER — Ambulatory Visit: Payer: 59

## 2019-07-31 DIAGNOSIS — M25512 Pain in left shoulder: Secondary | ICD-10-CM | POA: Diagnosis not present

## 2019-07-31 DIAGNOSIS — M24412 Recurrent dislocation, left shoulder: Secondary | ICD-10-CM | POA: Insufficient documentation

## 2019-07-31 DIAGNOSIS — Z87891 Personal history of nicotine dependence: Secondary | ICD-10-CM | POA: Insufficient documentation

## 2019-07-31 DIAGNOSIS — G8918 Other acute postprocedural pain: Secondary | ICD-10-CM | POA: Diagnosis not present

## 2019-07-31 DIAGNOSIS — S43492A Other sprain of left shoulder joint, initial encounter: Secondary | ICD-10-CM | POA: Diagnosis not present

## 2019-07-31 DIAGNOSIS — Z4789 Encounter for other orthopedic aftercare: Secondary | ICD-10-CM | POA: Diagnosis not present

## 2019-07-31 DIAGNOSIS — Z419 Encounter for procedure for purposes other than remedying health state, unspecified: Secondary | ICD-10-CM

## 2019-07-31 HISTORY — PX: ALLOGRAFT APPLICATION: SHX6404

## 2019-07-31 HISTORY — PX: SHOULDER ARTHROSCOPY WITH BANKART REPAIR: SHX5673

## 2019-07-31 LAB — URINE DRUG SCREEN, QUALITATIVE (ARMC ONLY)
Amphetamines, Ur Screen: NOT DETECTED
Barbiturates, Ur Screen: NOT DETECTED
Benzodiazepine, Ur Scrn: NOT DETECTED
Cannabinoid 50 Ng, Ur ~~LOC~~: POSITIVE — AB
Cocaine Metabolite,Ur ~~LOC~~: NOT DETECTED
MDMA (Ecstasy)Ur Screen: NOT DETECTED
Methadone Scn, Ur: NOT DETECTED
Opiate, Ur Screen: NOT DETECTED
Phencyclidine (PCP) Ur S: NOT DETECTED
Tricyclic, Ur Screen: NOT DETECTED

## 2019-07-31 SURGERY — SHOULDER ARTHROSCOPY WITH BANKART REPAIR
Anesthesia: General | Site: Shoulder | Laterality: Left

## 2019-07-31 MED ORDER — DEXMEDETOMIDINE HCL 200 MCG/2ML IV SOLN
INTRAVENOUS | Status: DC | PRN
  Administered 2019-07-31 (×2): 8 ug via INTRAVENOUS

## 2019-07-31 MED ORDER — MIDAZOLAM HCL 2 MG/2ML IJ SOLN
2.0000 mg | Freq: Once | INTRAMUSCULAR | Status: AC
  Administered 2019-07-31: 12:00:00 2 mg via INTRAVENOUS

## 2019-07-31 MED ORDER — OXYCODONE HCL 5 MG PO TABS: 5.0000 mg | ORAL_TABLET | ORAL | 0 refills | Status: DC | PRN

## 2019-07-31 MED ORDER — HYDROCODONE-ACETAMINOPHEN 7.5-325 MG PO TABS: 1.0000 | ORAL_TABLET | Freq: Once | ORAL | Status: DC | PRN

## 2019-07-31 MED ORDER — EPINEPHRINE PF 1 MG/ML IJ SOLN
INTRAMUSCULAR | Status: AC
  Filled 2019-07-31: qty 1

## 2019-07-31 MED ORDER — ACETAMINOPHEN 325 MG PO TABS: 325.0000 mg | ORAL_TABLET | ORAL | Status: DC | PRN

## 2019-07-31 MED ORDER — BUPIVACAINE HCL (PF) 0.5 % IJ SOLN
INTRAMUSCULAR | Status: AC
  Filled 2019-07-31: qty 30

## 2019-07-31 MED ORDER — DEXAMETHASONE SODIUM PHOSPHATE 10 MG/ML IJ SOLN
INTRAMUSCULAR | Status: DC | PRN
  Administered 2019-07-31: 10 mg via INTRAVENOUS

## 2019-07-31 MED ORDER — BUPIVACAINE LIPOSOME 1.3 % IJ SUSP
INTRAMUSCULAR | Status: AC
  Filled 2019-07-31: qty 20

## 2019-07-31 MED ORDER — METOCLOPRAMIDE HCL 5 MG/ML IJ SOLN: 5.0000 mg | Freq: Three times a day (TID) | INTRAMUSCULAR | Status: DC | PRN

## 2019-07-31 MED ORDER — ONDANSETRON HCL 4 MG PO TABS: 4.0000 mg | ORAL_TABLET | Freq: Four times a day (QID) | ORAL | Status: DC | PRN

## 2019-07-31 MED ORDER — ACETAMINOPHEN 10 MG/ML IV SOLN
INTRAVENOUS | Status: DC | PRN
  Administered 2019-07-31: 1000 mg via INTRAVENOUS

## 2019-07-31 MED ORDER — LIDOCAINE HCL (CARDIAC) PF 100 MG/5ML IV SOSY
PREFILLED_SYRINGE | INTRAVENOUS | Status: DC | PRN
  Administered 2019-07-31: 100 mg via INTRAVENOUS

## 2019-07-31 MED ORDER — PROMETHAZINE HCL 25 MG/ML IJ SOLN
INTRAMUSCULAR | Status: AC
  Administered 2019-07-31: 6.25 mg via INTRAVENOUS
  Filled 2019-07-31: qty 1

## 2019-07-31 MED ORDER — FENTANYL CITRATE (PF) 100 MCG/2ML IJ SOLN: 25.0000 ug | INTRAMUSCULAR | Status: DC | PRN

## 2019-07-31 MED ORDER — FENTANYL CITRATE (PF) 100 MCG/2ML IJ SOLN
100.0000 ug | Freq: Once | INTRAMUSCULAR | Status: AC
  Administered 2019-07-31: 12:00:00 100 ug via INTRAVENOUS

## 2019-07-31 MED ORDER — MEPERIDINE HCL 50 MG/ML IJ SOLN: 6.2500 mg | INTRAMUSCULAR | Status: DC | PRN

## 2019-07-31 MED ORDER — BUPIVACAINE-EPINEPHRINE (PF) 0.5% -1:200000 IJ SOLN
INTRAMUSCULAR | Status: DC | PRN
  Administered 2019-07-31: 30 mL

## 2019-07-31 MED ORDER — ONDANSETRON HCL 4 MG/2ML IJ SOLN
INTRAMUSCULAR | Status: DC | PRN
  Administered 2019-07-31: 4 mg via INTRAVENOUS

## 2019-07-31 MED ORDER — PROPOFOL 10 MG/ML IV BOLUS
INTRAVENOUS | Status: DC | PRN
  Administered 2019-07-31: 200 mg via INTRAVENOUS

## 2019-07-31 MED ORDER — FAMOTIDINE 20 MG PO TABS
ORAL_TABLET | ORAL | Status: AC
  Administered 2019-07-31: 20 mg via ORAL
  Filled 2019-07-31: qty 1

## 2019-07-31 MED ORDER — FENTANYL CITRATE (PF) 100 MCG/2ML IJ SOLN
INTRAMUSCULAR | Status: AC
  Administered 2019-07-31: 100 ug via INTRAVENOUS
  Filled 2019-07-31: qty 2

## 2019-07-31 MED ORDER — ONDANSETRON HCL 4 MG/2ML IJ SOLN
INTRAMUSCULAR | Status: AC
  Filled 2019-07-31: qty 2

## 2019-07-31 MED ORDER — LIDOCAINE HCL (PF) 1 % IJ SOLN
INTRAMUSCULAR | Status: AC
  Filled 2019-07-31: qty 5

## 2019-07-31 MED ORDER — PROMETHAZINE HCL 25 MG/ML IJ SOLN
6.2500 mg | INTRAMUSCULAR | Status: DC | PRN
  Administered 2019-07-31: 21:00:00 6.25 mg via INTRAVENOUS

## 2019-07-31 MED ORDER — KETOROLAC TROMETHAMINE 30 MG/ML IJ SOLN
INTRAMUSCULAR | Status: DC | PRN
  Administered 2019-07-31: 30 mg via INTRAVENOUS

## 2019-07-31 MED ORDER — ONDANSETRON HCL 4 MG/2ML IJ SOLN
INTRAMUSCULAR | Status: AC
  Administered 2019-07-31: 4 mg via INTRAVENOUS
  Filled 2019-07-31: qty 2

## 2019-07-31 MED ORDER — SUGAMMADEX SODIUM 200 MG/2ML IV SOLN
INTRAVENOUS | Status: DC | PRN
  Administered 2019-07-31: 142.6 mg via INTRAVENOUS

## 2019-07-31 MED ORDER — FAMOTIDINE 20 MG PO TABS
20.0000 mg | ORAL_TABLET | Freq: Once | ORAL | Status: AC
  Administered 2019-07-31: 11:00:00 20 mg via ORAL

## 2019-07-31 MED ORDER — CEFAZOLIN SODIUM-DEXTROSE 2-4 GM/100ML-% IV SOLN
INTRAVENOUS | Status: AC
  Filled 2019-07-31: qty 100

## 2019-07-31 MED ORDER — SODIUM CHLORIDE 0.9 % IV SOLN
INTRAVENOUS | Status: DC | PRN
  Administered 2019-07-31: 25 ug/min via INTRAVENOUS

## 2019-07-31 MED ORDER — FENTANYL CITRATE (PF) 100 MCG/2ML IJ SOLN
INTRAMUSCULAR | Status: AC
  Filled 2019-07-31: qty 2

## 2019-07-31 MED ORDER — ACETAMINOPHEN 10 MG/ML IV SOLN
INTRAVENOUS | Status: AC
  Filled 2019-07-31: qty 100

## 2019-07-31 MED ORDER — SUCCINYLCHOLINE CHLORIDE 20 MG/ML IJ SOLN
INTRAMUSCULAR | Status: AC
  Filled 2019-07-31: qty 1

## 2019-07-31 MED ORDER — BUPIVACAINE HCL (PF) 0.5 % IJ SOLN
INTRAMUSCULAR | Status: AC
  Filled 2019-07-31: qty 10

## 2019-07-31 MED ORDER — PROPOFOL 10 MG/ML IV BOLUS
INTRAVENOUS | Status: AC
  Filled 2019-07-31: qty 20

## 2019-07-31 MED ORDER — DEXMEDETOMIDINE HCL IN NACL 80 MCG/20ML IV SOLN
INTRAVENOUS | Status: AC
  Filled 2019-07-31: qty 20

## 2019-07-31 MED ORDER — FENTANYL CITRATE (PF) 100 MCG/2ML IJ SOLN
INTRAMUSCULAR | Status: DC | PRN
  Administered 2019-07-31: 50 ug via INTRAVENOUS
  Administered 2019-07-31: 25 ug via INTRAVENOUS
  Administered 2019-07-31: 50 ug via INTRAVENOUS
  Administered 2019-07-31: 25 ug via INTRAVENOUS
  Administered 2019-07-31: 50 ug via INTRAVENOUS

## 2019-07-31 MED ORDER — CHLORHEXIDINE GLUCONATE 4 % EX LIQD
60.0000 mL | Freq: Once | CUTANEOUS | Status: AC
  Administered 2019-07-31: 4 via TOPICAL

## 2019-07-31 MED ORDER — ACETAMINOPHEN 160 MG/5ML PO SOLN: 325.0000 mg | ORAL | Status: DC | PRN

## 2019-07-31 MED ORDER — CEFAZOLIN SODIUM-DEXTROSE 2-4 GM/100ML-% IV SOLN
2.0000 g | INTRAVENOUS | Status: AC
  Administered 2019-07-31: 2 g via INTRAVENOUS

## 2019-07-31 MED ORDER — BUPIVACAINE HCL (PF) 0.5 % IJ SOLN
INTRAMUSCULAR | Status: DC | PRN
  Administered 2019-07-31: 10 mL

## 2019-07-31 MED ORDER — LEVETIRACETAM 500 MG PO TABS
500.0000 mg | ORAL_TABLET | Freq: Once | ORAL | Status: DC
  Filled 2019-07-31: qty 1

## 2019-07-31 MED ORDER — POTASSIUM CHLORIDE IN NACL 20-0.9 MEQ/L-% IV SOLN: INTRAVENOUS | Status: DC

## 2019-07-31 MED ORDER — ROCURONIUM BROMIDE 50 MG/5ML IV SOLN
INTRAVENOUS | Status: AC
  Filled 2019-07-31: qty 1

## 2019-07-31 MED ORDER — MIDAZOLAM HCL 2 MG/2ML IJ SOLN
INTRAMUSCULAR | Status: AC
  Administered 2019-07-31: 2 mg via INTRAVENOUS
  Filled 2019-07-31: qty 2

## 2019-07-31 MED ORDER — DEXAMETHASONE SODIUM PHOSPHATE 10 MG/ML IJ SOLN
INTRAMUSCULAR | Status: AC
  Filled 2019-07-31: qty 1

## 2019-07-31 MED ORDER — PHENYLEPHRINE HCL (PRESSORS) 10 MG/ML IV SOLN
INTRAVENOUS | Status: DC | PRN
  Administered 2019-07-31: 50 ug via INTRAVENOUS

## 2019-07-31 MED ORDER — BUPIVACAINE LIPOSOME 1.3 % IJ SUSP
INTRAMUSCULAR | Status: DC | PRN
  Administered 2019-07-31: 20 mL

## 2019-07-31 MED ORDER — SODIUM CHLORIDE FLUSH 0.9 % IV SOLN
INTRAVENOUS | Status: AC
  Filled 2019-07-31: qty 10

## 2019-07-31 MED ORDER — ONDANSETRON HCL 4 MG/2ML IJ SOLN
4.0000 mg | Freq: Four times a day (QID) | INTRAMUSCULAR | Status: DC | PRN
  Administered 2019-07-31: 20:00:00 4 mg via INTRAVENOUS

## 2019-07-31 MED ORDER — METOCLOPRAMIDE HCL 10 MG PO TABS: 5.0000 mg | ORAL_TABLET | Freq: Three times a day (TID) | ORAL | Status: DC | PRN

## 2019-07-31 MED ORDER — ROCURONIUM BROMIDE 100 MG/10ML IV SOLN
INTRAVENOUS | Status: DC | PRN
  Administered 2019-07-31: 10 mg via INTRAVENOUS
  Administered 2019-07-31: 50 mg via INTRAVENOUS
  Administered 2019-07-31: 10 mg via INTRAVENOUS
  Administered 2019-07-31: 20 mg via INTRAVENOUS
  Administered 2019-07-31: 10 mg via INTRAVENOUS

## 2019-07-31 MED ORDER — OXYCODONE HCL 5 MG PO TABS: 5.0000 mg | ORAL_TABLET | ORAL | Status: DC | PRN

## 2019-07-31 MED ORDER — SEVOFLURANE IN SOLN
RESPIRATORY_TRACT | Status: AC
  Filled 2019-07-31: qty 250

## 2019-07-31 MED ORDER — LACTATED RINGERS IV SOLN
INTRAVENOUS | Status: DC
  Administered 2019-07-31 (×2): via INTRAVENOUS

## 2019-07-31 MED ORDER — LIDOCAINE HCL (PF) 2 % IJ SOLN
INTRAMUSCULAR | Status: AC
  Filled 2019-07-31: qty 10

## 2019-07-31 SURGICAL SUPPLY — 108 items
0.62 kwire acumed ×6 IMPLANT
ANCHOR ALL-SUT Q-FIX 1.8 BLUE (Anchor) ×3 IMPLANT
ANCHOR JUGGERKNOT WTAP NDL 2.9 (Anchor) ×6 IMPLANT
ANCHOR SUT 1.8 FBRTK KNTLS 2SU (Anchor) ×9 IMPLANT
BAG DECANTER FOR FLEXI CONT (MISCELLANEOUS) IMPLANT
BIT DRILL FLX 1.8 FX SUT ANCH (BIT) ×1 IMPLANT
BIT DRILL JUGRKNT W/NDL BIT2.9 (DRILL) ×1 IMPLANT
BIT DRILL LONG ACUTRAK2 4.7 (BIT) ×1 IMPLANT
BIT DRILL RIGD1.8MM FBRTK STRL (DRILL) ×1 IMPLANT
BLADE FULL RADIUS 3.5 (BLADE) ×3 IMPLANT
BLADE MED AGGRESSIVE (BLADE) ×3 IMPLANT
BLADE SAGITTAL WIDE XTHICK NO (BLADE) IMPLANT
BOWL CEMENT MIX W/ADAPTER (MISCELLANEOUS) IMPLANT
BUR 4X45 EGG (BURR) ×3 IMPLANT
BUR ACROMIONIZER 4.0 (BURR) ×3 IMPLANT
CANISTER SUCT 1200ML W/VALVE (MISCELLANEOUS) ×3 IMPLANT
CANISTER SUCT 3000ML PPV (MISCELLANEOUS) IMPLANT
CANNULA SHAVER 8MMX76MM (CANNULA) ×3 IMPLANT
CHLORAPREP W/TINT 26 (MISCELLANEOUS) ×3 IMPLANT
COOLER ICEMAN CLASSIC (MISCELLANEOUS) ×3 IMPLANT
COVER BACK TABLE REUSABLE LG (DRAPES) ×3 IMPLANT
COVER MAYO STAND REUSABLE (DRAPES) ×3 IMPLANT
COVER WAND RF STERILE (DRAPES) IMPLANT
DEVICE SUCT BLK HOLE OR FLOOR (MISCELLANEOUS) ×6 IMPLANT
DRAPE 3/4 80X56 (DRAPES) ×6 IMPLANT
DRAPE C-ARM XRAY 36X54 (DRAPES) ×6 IMPLANT
DRAPE INCISE IOBAN 66X45 STRL (DRAPES) ×6 IMPLANT
DRAPE SPLIT 6X30 W/TAPE (DRAPES) ×6 IMPLANT
DRILL FLEX 1.8 Q FIX  SUT ANCH (BIT) ×2
DRILL FLEX 1.8 Q FIX SUT ANCH (BIT) ×1
DRILL JUGGERKNOT W/NDL BIT 2.9 (DRILL) ×3
DRILL LONG ACUTRAK2 4.7 (BIT) ×3
DRILL RIGID 1.8MM FBRTK STRL (DRILL) ×3
DRSG OPSITE POSTOP 4X8 (GAUZE/BANDAGES/DRESSINGS) IMPLANT
ELECT BLADE 6.5 EXT (BLADE) ×3 IMPLANT
ELECT CAUTERY BLADE 6.4 (BLADE) ×3 IMPLANT
ELECT REM PT RETURN 9FT ADLT (ELECTROSURGICAL) ×3
ELECTRODE REM PT RTRN 9FT ADLT (ELECTROSURGICAL) ×1 IMPLANT
GAUZE PACK 2X3YD (GAUZE/BANDAGES/DRESSINGS) ×3 IMPLANT
GAUZE SPONGE 4X4 12PLY STRL (GAUZE/BANDAGES/DRESSINGS) ×3 IMPLANT
GAUZE XEROFORM 1X8 LF (GAUZE/BANDAGES/DRESSINGS) ×3 IMPLANT
GLOVE BIO SURGEON STRL SZ7.5 (GLOVE) ×12 IMPLANT
GLOVE BIO SURGEON STRL SZ8 (GLOVE) ×12 IMPLANT
GLOVE BIOGEL PI IND STRL 8 (GLOVE) ×1 IMPLANT
GLOVE BIOGEL PI INDICATOR 8 (GLOVE) ×2
GLOVE INDICATOR 8.0 STRL GRN (GLOVE) ×3 IMPLANT
GOWN STRL REUS W/ TWL LRG LVL3 (GOWN DISPOSABLE) ×1 IMPLANT
GOWN STRL REUS W/ TWL XL LVL3 (GOWN DISPOSABLE) ×1 IMPLANT
GOWN STRL REUS W/TWL LRG LVL3 (GOWN DISPOSABLE) ×2
GOWN STRL REUS W/TWL XL LVL3 (GOWN DISPOSABLE) ×2
GRASPER SUT 15 45D LOW PRO (SUTURE) IMPLANT
GUIDEWIRE ORTHO.062IN X 9.25IN (WIRE) ×6 IMPLANT
HEAD HUMERAL LEFT FSH (Tissue) ×3 IMPLANT
HOOD PEEL AWAY FLYTE STAYCOOL (MISCELLANEOUS) IMPLANT
ILLUMINATOR WAVEGUIDE N/F (MISCELLANEOUS) ×3 IMPLANT
IV LACTATED RINGER IRRG 3000ML (IV SOLUTION) ×14
IV LR IRRIG 3000ML ARTHROMATIC (IV SOLUTION) ×7 IMPLANT
IV NS 100ML SINGLE PACK (IV SOLUTION) IMPLANT
KIT SPEAR STR 1.6MM DRILL (MISCELLANEOUS) ×3 IMPLANT
KIT STABILIZATION SHOULDER (MISCELLANEOUS) ×3 IMPLANT
KIT SUTURE 1.8 Q-FIX DISP (KITS) ×3 IMPLANT
LASSO 25 DEG QUICKPASS CVD LT (SUTURE) ×3 IMPLANT
MANIFOLD NEPTUNE II (INSTRUMENTS) ×3 IMPLANT
MASK FACE SPIDER DISP (MASK) ×3 IMPLANT
MAT ABSORB  FLUID 56X50 GRAY (MISCELLANEOUS) ×2
MAT ABSORB FLUID 56X50 GRAY (MISCELLANEOUS) ×1 IMPLANT
NDL MAYO CATGUT SZ5 (NEEDLE)
NDL SAFETY ECLIPSE 18X1.5 (NEEDLE) IMPLANT
NDL SUT 5 .5 CRC TPR PNT MAYO (NEEDLE) IMPLANT
NEEDLE HYPO 18GX1.5 SHARP (NEEDLE)
NEEDLE REVERSE CUT 1/2 CRC (NEEDLE) IMPLANT
NEEDLE SPNL 20GX3.5 QUINCKE YW (NEEDLE) ×3 IMPLANT
NS IRRIG 500ML POUR BTL (IV SOLUTION) ×3 IMPLANT
PACK ARTHROSCOPY SHOULDER (MISCELLANEOUS) ×3 IMPLANT
PAD COLD SHLDR WRAP-ON (PAD) ×3 IMPLANT
PAD WRAPON POLAR SHDR UNIV (MISCELLANEOUS) IMPLANT
PENCIL SMOKE EVACUATOR (MISCELLANEOUS) IMPLANT
PULSAVAC PLUS IRRIG FAN TIP (DISPOSABLE)
SCREW ACUTRAK2  4.7 35.0MM (Screw) ×4 IMPLANT
SCREW ACUTRAK2 4.7 35.0MM (Screw) ×2 IMPLANT
SLING ULTRA II LG (MISCELLANEOUS) IMPLANT
SLING ULTRA II M (MISCELLANEOUS) ×3 IMPLANT
SPONGE LAP 18X18 RF (DISPOSABLE) ×3 IMPLANT
STAPLER SKIN PROX 35W (STAPLE) ×3 IMPLANT
STRAP SAFETY 5IN WIDE (MISCELLANEOUS) IMPLANT
SUT ETHIBOND 0 MO6 C/R (SUTURE) ×3 IMPLANT
SUT FIBERWIRE #2 38 BLUE 1/2 (SUTURE)
SUT VIC AB 0 CT1 36 (SUTURE) ×6 IMPLANT
SUT VIC AB 0 CT2 27 (SUTURE) ×6 IMPLANT
SUT VIC AB 2-0 CT1 27 (SUTURE) ×4
SUT VIC AB 2-0 CT1 TAPERPNT 27 (SUTURE) ×2 IMPLANT
SUTURE FIBERWR #2 38 BLUE 1/2 (SUTURE) IMPLANT
SYR 10ML LL (SYRINGE) ×3 IMPLANT
SYR 30ML LL (SYRINGE) ×6 IMPLANT
SYRINGE IRR TOOMEY STRL 70CC (SYRINGE) IMPLANT
TAPE MICROFOAM 4IN (TAPE) ×3 IMPLANT
TAPE TRANSPORE STRL 2 31045 (GAUZE/BANDAGES/DRESSINGS) ×3 IMPLANT
TIP FAN IRRIG PULSAVAC PLUS (DISPOSABLE) IMPLANT
TRAY FOLEY MTR SLVR 16FR STAT (SET/KITS/TRAYS/PACK) IMPLANT
TUBING ARTHRO INFLOW-ONLY STRL (TUBING) ×3 IMPLANT
TUBING CONNECTING 10 (TUBING) ×2 IMPLANT
TUBING CONNECTING 10' (TUBING) ×1
WAND WEREWOLF FLOW 90D (MISCELLANEOUS) ×3 IMPLANT
WIRE Z .045 C-WIRE SPADE TIP (WIRE) ×3 IMPLANT
WRAPON POLAR PAD SHDR UNIV (MISCELLANEOUS)
acumed long 4.7mmdrill ×3 IMPLANT
acumed profiole 4.7mm drill ×2 IMPLANT
qfix flexible drill 1.8mm ×3 IMPLANT

## 2019-07-31 NOTE — Anesthesia Post-op Follow-up Note (Signed)
Anesthesia QCDR form completed.        

## 2019-07-31 NOTE — Anesthesia Procedure Notes (Signed)
Procedure Name: Intubation Date/Time: 07/31/2019 1:46 PM Performed by: Leeroy Cha, CRNA Pre-anesthesia Checklist: Patient identified, Emergency Drugs available, Suction available and Patient being monitored Patient Re-evaluated:Patient Re-evaluated prior to induction Oxygen Delivery Method: Circle system utilized Preoxygenation: Pre-oxygenation with 100% oxygen Induction Type: IV induction Ventilation: Mask ventilation without difficulty Laryngoscope Size: McGraph and 4 Grade View: Grade II Tube type: Oral Tube size: 7.5 mm Number of attempts: 2 Airway Equipment and Method: Stylet and Oral airway Placement Confirmation: ETT inserted through vocal cords under direct vision,  positive ETCO2 and breath sounds checked- equal and bilateral Secured at: 22 cm Tube secured with: Tape Dental Injury: Teeth and Oropharynx as per pre-operative assessment  Comments: First attempt with MAC4 blade, poor view of glottis, switched to video laryngoscope

## 2019-07-31 NOTE — Anesthesia Procedure Notes (Signed)
Anesthesia Regional Block: Interscalene brachial plexus block   Pre-Anesthetic Checklist: ,, timeout performed, Correct Patient, Correct Site, Correct Laterality, Correct Procedure, Correct Position, site marked, Risks and benefits discussed,  Surgical consent,  Pre-op evaluation,  At surgeon's request and post-op pain management  Laterality: Left  Prep: chloraprep       Needles:  Injection technique: Single-shot  Needle Type: Echogenic Stimulator Needle     Needle Length: 10cm  Needle Gauge: 21   Needle insertion depth: 4 cm   Additional Needles:   Procedures: Doppler guided,,,, ultrasound used (permanent image in chart),,,,  Motor weakness within 4 minutes.  Narrative:  Start time: 07/31/2019 12:11 PM End time: 07/31/2019 12:20 PM Injection made incrementally with aspirations every 5 mL.  Performed by: Personally  Anesthesiologist: Alphonsus Sias, MD  Additional Notes: Functioning IV was confirmed, O2 and monitors were applied.  A 173mm 21ga ECHOplex needle was used. Sterile prep and drape,hand hygiene and sterile gloves were used.  Negative aspiration and negative test dose prior to incremental administration of local anesthetic. 66ml exparel/26ml 0.5% bupiv, 1% lido skin wheal. Images stored. The patient tolerated the procedure well.

## 2019-07-31 NOTE — Anesthesia Postprocedure Evaluation (Addendum)
Anesthesia Post Note  Patient: Martin Gonzalez  Procedure(s) Performed: SHOULDER ARTHROSCOPY WITH DEBRIDEMENT ARTHROSCOPIC VERSUS BANKART REPAIR AND ALLOGRAFT GRAFTING OF A LARGE HUMERAL HILL-SACHS LESION. (Left Shoulder) ALLOGRAFT APPLICATION (Left Shoulder)  Patient location during evaluation: PACU Anesthesia Type: General Level of consciousness: awake and alert Pain management: pain level controlled Vital Signs Assessment: post-procedure vital signs reviewed and stable Respiratory status: spontaneous breathing, nonlabored ventilation and respiratory function stable Cardiovascular status: blood pressure returned to baseline and stable : Nausea, improved with Zofran and Phenergan.   Pt states that the right side of his tongue feels "weird" or "numb."  On exam, he has normal sensation of his tongue, but on tongue protrusion there is rightward deviation, and fasciculations are noted on the tongue.  DDx includes CN XII palsy/injury, stroke, migration of local anesthetic from peripheral nerve block. Stroke less likely due to patient's low risk factors, absence of other PE findings and an uncomplicated perioperative course.  Migration of local anesthetic from interscalene nerve block also seems unlikely because his block was on the opposite side.  No known injury or unusual events during intubation or extubation, or during perioperative period.  My concern of possible CN palsy was discussed with the patient and his mother.  The patient is already established with a neurologist due to history of seizures. At this time he is stable for PACU discharge. I recommended that they call their neurologist's office tomorrow for evaluation if his symptoms are still persisting.  Pt and mother expressed understanding and agreement with this plan. KLF  Last Vitals:  Vitals:   07/31/19 2013 07/31/19 2020  BP:  136/80  Pulse: 81 65  Resp: 17 18  Temp:  (!) 36.2 C  SpO2: 97% 100%    Last Pain:  Vitals:   07/31/19 2010  TempSrc:   PainSc: 0-No pain                 Durenda Hurt
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
## 2019-07-31 NOTE — H&P (Signed)
Paper H&P to be scanned into permanent record. H&P reviewed and patient re-examined. No changes. 

## 2019-07-31 NOTE — Op Note (Signed)
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  Post-Op Diagnosis:   Same.  Procedure:   Limited arthroscopic debridement, arthroscopic Bankart repair, and open osteochondral allografting of large Hill-Sachs lesion, left shoulder.  Anesthesia:   General endotracheal with interscalene block using Exparel placed preoperatively by the anesthesiologist.  Surgeons:   J. Derald Macleod, MD; Signa Kell, MD  Assistant:   Hunt Oris, PA-S  Findings:   As above.  The rotator cuff was in excellent condition, as was the biceps tendon.  The articular surfaces also in excellent condition.  The Hill-Sachs lesion measured approximately 3.5 cm in superior-inferior direction, 2.5 cm in anterior-posterior direction, and approximately 1.7 cm in depth.  The Bankart tear extended from the 6 o'clock position to the 10 o'clock position anteriorly.  Complications:   None  Fluids:   1600 cc  Estimated blood loss:   50 cc  Tourniquet time:   None  Drains:   None  Closure:   Staples      Brief clinical note:   The patient is a 23 year old male who sustained an acute dislocation of his left shoulder when he had a seizure while working at [employer] 1 year ago. As result of the seizure and subsequent dislocation, he developed a large Hill-Sachs lesion, predisposing him to several additional recurrent dislocations. These findings were confirmed by MRI scan and CT scan. The patient presents at this time for definitive management of his shoulder symptoms.  Procedure:   The patient underwent placement of an interscalene block using Exparel by the anesthesiologist in the preoperative holding area before being brought into the operating room and lain in the supine position. The patient then underwent general endotracheal intubation and anesthesia before being repositioned in the  beach chair position using the beach chair positioner. The left shoulder and upper extremity were prepped with ChloraPrep solution before being draped sterilely. Preoperative antibiotics were administered. A timeout was performed to confirm the proper surgical site before the expected portal sites and incision site were injected with 0.5% Sensorcaine with epinephrine. A posterior portal was created and the glenohumeral joint thoroughly inspected with the findings as described above. A low anterior portal as well as a high superolateral portal were created using an outside-in technique. The labrum and rotator cuff were further probed, again confirming the above-noted findings.   The Bankart tear was repaired using one Smith [employer] Nephew 1.8 mm Q-Fix anchor placed at the 6:30 position and three Arthrex knotless FiberTaks placed at the 7:30, 8:30, and 9:30 positions. It was noted that there was very little true labral tissue and that most of it was capsular tissue. The instruments were removed from the joint after suctioning the excess fluid.  An approximately 6-8 cm incision was made over the postterolateral aspect of the shoulder beginning at the posterolateral corner of the acromion and extending distally. This incision was carried down through the subcutaneous tissues to expose the deltoid fascia. The raphae between the posterior and middle thirds was identified and this plane developed to provide access into the sub deltoid space. The interval between the infraspinatus and teres minor tendons was identified and developed to provide access to the posterior aspect of the glenohumeral joint. The capsule was incised vertically to expose the Hill-Sachs lesion in the posterior aspect of the humeral head.   The Hill-Sachs lesion was carefully burred out to remove  any fibrinous tissues and to get down to a good bed of bleeding bone. The defect was carefully measured before a piece of similar size was harvested from a  fresh left humeral head. After several attempts at fitting it and making small adjustments, the osteochondral allograft was felt to fit well.  It was temporarily secured using two guidewires. The adequacy of graft and hardware position was verified fluoroscopically in the AP plane in internal, neutral, and external projections. Again, several adjustments were made in order to optimize the position of the guidewires and the position of the allograft. Finally, the allograft was felt to be in a near anatomic position and was secured using two 35 mm Acumed 4.5 mm headless screws. Again the adequacy of graft and hardware position was verified fluoroscopically in the AP plane in internal, neutral, and external rotated positions.  The wound was copiously irrigated with sterile saline solution before the infraspinatus was reattached to the posterior margin of the greater tuberosity using two Biomet 2.9 mm JuggerKnot anchors. Prior to tying down these sutures to complete an open remplissage, the posterior capsule was reapproximated using #0 Vicryl interrupted sutures. The interval between the infraspinatus and teres minor also was reapproximated using #0 Vicryl interrupted sutures. The deltoid raphae was reapproximated using 2-0 Vicryl interrupted sutures. The subcutaneous tissues were closed in two layers using 2-0 Vicryl interrupted sutures before the skin was closed using staples. The portal sites also were closed using staples. A sterile bulky dressing was applied to the shoulder before the arm was placed into a DonJoy Polar Care device and a sling shot shoulder immobilizer. The patient was then awakened, extubated, and returned to the recovery room in satisfactory condition after tolerating the procedure well.

## 2019-07-31 NOTE — Transfer of Care (Signed)
Immediate Anesthesia Transfer of Care Note  Patient: Martin Gonzalez  Procedure(s) Performed: SHOULDER ARTHROSCOPY WITH DEBRIDEMENT ARTHROSCOPIC VERSUS BANKART REPAIR AND ALLOGRAFT GRAFTING OF A LARGE HUMERAL HILL-SACHS LESION. (Left Shoulder) ALLOGRAFT APPLICATION (Left Shoulder)  Patient Location: PACU  Anesthesia Type:General  Level of Consciousness: drowsy  Airway & Oxygen Therapy: Patient Spontanous Breathing and Patient connected to face mask oxygen  Post-op Assessment: Report given to RN and Post -op Vital signs reviewed and stable  Post vital signs: Reviewed and stable  Last Vitals:  Vitals Value Taken Time  BP 120/52 07/31/19 1910  Temp    Pulse 74 07/31/19 1913  Resp 22 07/31/19 1913  SpO2 98 % 07/31/19 1913  Vitals shown include unvalidated device data.  Last Pain:  Vitals:   07/31/19 1221  TempSrc:   PainSc: 0-No pain         Complications: No apparent anesthesia complications

## 2019-07-31 NOTE — Discharge Instructions (Addendum)
Interscalene Nerve Block with Exparel  1.  For your surgery you have received an Interscalene Nerve Block with Exparel. 2. Nerve Blocks affect many types of nerves, including nerves that control movement, pain and normal sensation.  You may experience feelings such as numbness, tingling, heaviness, weakness or the inability to move your arm or the feeling or sensation that your arm has "fallen asleep". 3. A nerve block with Exparel can last up to 5 days.  Usually the weakness wears off first.  The tingling and heaviness usually wear off next.  Finally you may start to notice pain.  Keep in mind that this may occur in any order.  Once a nerve block starts to wear off it is usually completely gone within 60 minutes. 4. ISNB may cause mild shortness of breath, a hoarse voice, blurry vision, unequal pupils, or drooping of the face on the same side as the nerve block.  These symptoms will usually resolve with the numbness.  Very rarely the procedure itself can cause mild seizures. 5. If needed, your surgeon will give you a prescription for pain medication.  It will take about 60 minutes for the oral pain medication to become fully effective.  So, it is recommended that you start taking this medication before the nerve block first begins to wear off, or when you first begin to feel discomfort. 6. Take your pain medication only as prescribed.  Pain medication can cause sedation and decrease your breathing if you take more than you need for the level of pain that you have. 7. Nausea is a common side effect of many pain medications.  You may want to eat something before taking your pain medicine to prevent nausea. 8. After an Interscalene nerve block, you cannot feel pain, pressure or extremes in temperature in the effected arm.  Because your arm is numb it is at an increased risk for injury.  To decrease the possibility of injury, please practice the following:  a. While you are awake change the position of  your arm frequently to prevent too much pressure on any one area for prolonged periods of time. b.  If you have a cast or tight dressing, check the color or your fingers every couple of hours.  Call your surgeon with the appearance of any discoloration (white or blue). c. If you are given a sling to wear before you go home, please wear it  at all times until the block has completely worn off.  Do not get up at night without your sling. d. Please contact [facility] or your surgeon if you do not begin to regain sensation after 7 days from the surgery.  Anesthesia may be contacted by calling the Same Day Surgery Department, Mon. through Fri., 6 am to 4 pm at [phone].   e. If you experience any other problems or concerns, please contact your surgeon's office. If you experience severe or prolonged shortness of breath go to the nearest emergency department.     AMBULATORY SURGERY  DISCHARGE INSTRUCTIONS   1) The drugs that you were given will stay in your system until tomorrow so for the next 24 hours you should not:  A) Drive an automobile B) Make any legal decisions C) Drink any alcoholic beverage   2) You may resume regular meals tomorrow.  Today it is better to start with liquids and gradually work up to solid foods.  You may eat anything you prefer, but it is better to start with liquids, then  soup and crackers, and gradually work up to solid foods.   3) Please notify your doctor immediately if you have any unusual bleeding, trouble breathing, redness and pain at the surgery site, drainage, fever, or pain not relieved by medication. 4)   5) Your post-operative visit with Dr.                                     is: Date:                        Time:    Please call to schedule your post-operative visit.  6) Additional Instructions:     Orthopedic discharge instructions: Keep dressing dry and intact.  May shower after dressing changed on post-op day #4 (Monday).    Cover staples/sutures with Band-Aids after drying off. Apply ice frequently to shoulder or use polar care device. Take ibuprofen 600-800 mg TID with meals for 7-10 days, then as necessary. Take oxycodone as prescribed when needed.  May supplement with ES Tylenol if necessary. Keep shoulder immobilizer on at all times except may remove for bathing purposes. Follow-up in 10-14 days or as scheduled.

## 2019-08-01 ENCOUNTER — Encounter: Payer: Self-pay | Admitting: Surgery

## 2019-08-04 ENCOUNTER — Other Ambulatory Visit: Payer: Self-pay | Admitting: Neurology

## 2019-08-04 DIAGNOSIS — I639 Cerebral infarction, unspecified: Secondary | ICD-10-CM

## 2019-08-05 DIAGNOSIS — G8929 Other chronic pain: Secondary | ICD-10-CM | POA: Diagnosis not present

## 2019-08-05 DIAGNOSIS — M25512 Pain in left shoulder: Secondary | ICD-10-CM | POA: Diagnosis not present

## 2019-08-06 ENCOUNTER — Ambulatory Visit
Admission: RE | Admit: 2019-08-06 | Discharge: 2019-08-06 | Disposition: A | Discharge status: Home / Self Care | Payer: 59 | Source: Ambulatory Visit | Attending: Neurology | Admitting: Neurology

## 2019-08-06 ENCOUNTER — Other Ambulatory Visit: Payer: Self-pay

## 2019-08-06 DIAGNOSIS — I639 Cerebral infarction, unspecified: Secondary | ICD-10-CM | POA: Diagnosis not present

## 2019-08-06 DIAGNOSIS — R4781 Slurred speech: Secondary | ICD-10-CM | POA: Diagnosis not present

## 2019-08-11 DIAGNOSIS — M25512 Pain in left shoulder: Secondary | ICD-10-CM | POA: Diagnosis not present

## 2019-08-11 DIAGNOSIS — G8929 Other chronic pain: Secondary | ICD-10-CM | POA: Diagnosis not present

## 2019-08-18 DIAGNOSIS — G8929 Other chronic pain: Secondary | ICD-10-CM | POA: Diagnosis not present

## 2019-08-18 DIAGNOSIS — M25512 Pain in left shoulder: Secondary | ICD-10-CM | POA: Diagnosis not present

## 2019-08-28 DIAGNOSIS — M25512 Pain in left shoulder: Secondary | ICD-10-CM | POA: Diagnosis not present

## 2019-08-28 DIAGNOSIS — G8929 Other chronic pain: Secondary | ICD-10-CM | POA: Diagnosis not present

## 2019-09-01 DIAGNOSIS — G8929 Other chronic pain: Secondary | ICD-10-CM | POA: Diagnosis not present

## 2019-09-01 DIAGNOSIS — M25512 Pain in left shoulder: Secondary | ICD-10-CM | POA: Diagnosis not present

## 2019-09-11 DIAGNOSIS — M25612 Stiffness of left shoulder, not elsewhere classified: Secondary | ICD-10-CM | POA: Diagnosis not present

## 2019-09-11 DIAGNOSIS — M6281 Muscle weakness (generalized): Secondary | ICD-10-CM | POA: Diagnosis not present

## 2019-09-11 DIAGNOSIS — M25512 Pain in left shoulder: Secondary | ICD-10-CM | POA: Diagnosis not present

## 2019-09-11 DIAGNOSIS — G8929 Other chronic pain: Secondary | ICD-10-CM | POA: Diagnosis not present

## 2019-09-15 DIAGNOSIS — S43492D Other sprain of left shoulder joint, subsequent encounter: Secondary | ICD-10-CM | POA: Diagnosis not present

## 2019-09-17 DIAGNOSIS — G8929 Other chronic pain: Secondary | ICD-10-CM | POA: Diagnosis not present

## 2019-09-17 DIAGNOSIS — M25512 Pain in left shoulder: Secondary | ICD-10-CM | POA: Diagnosis not present

## 2019-09-19 MED FILL — levETIRAcetam 500 MG TABS: 500 | 90 days supply | Qty: 180 | Fill #3

## 2019-09-22 ENCOUNTER — Telehealth: Payer: Self-pay | Admitting: Family Medicine

## 2019-09-22 DIAGNOSIS — H52223 Regular astigmatism, bilateral: Secondary | ICD-10-CM | POA: Diagnosis not present

## 2019-09-22 NOTE — Telephone Encounter (Signed)
Patient stated that back in June paperwork was sent over for his disability.  He said he called back in November to verify we received the information and was told it would be sent over to the company he is filling his disability with. But he spoke with them recently and they have no received anything from the office   Please advise

## 2019-09-22 NOTE — Telephone Encounter (Signed)
We received paperwork from [employer] in June and nov I gave pt phone number for [employer] [phone] for pt to call to get update on records

## 2019-09-23 DIAGNOSIS — G8929 Other chronic pain: Secondary | ICD-10-CM | POA: Diagnosis not present

## 2019-09-23 DIAGNOSIS — M25512 Pain in left shoulder: Secondary | ICD-10-CM | POA: Diagnosis not present

## 2019-09-25 DIAGNOSIS — M25512 Pain in left shoulder: Secondary | ICD-10-CM | POA: Diagnosis not present

## 2019-09-25 DIAGNOSIS — G8929 Other chronic pain: Secondary | ICD-10-CM | POA: Diagnosis not present

## 2019-10-01 ENCOUNTER — Ambulatory Visit: Payer: 59 | Attending: Internal Medicine

## 2019-10-01 DIAGNOSIS — Z20822 Contact with and (suspected) exposure to covid-19: Secondary | ICD-10-CM | POA: Diagnosis not present

## 2019-10-02 LAB — NOVEL CORONAVIRUS, NAA: SARS-CoV-2, NAA: NOT DETECTED

## 2019-10-03 DIAGNOSIS — M25512 Pain in left shoulder: Secondary | ICD-10-CM | POA: Diagnosis not present

## 2019-10-03 DIAGNOSIS — G8929 Other chronic pain: Secondary | ICD-10-CM | POA: Diagnosis not present

## 2019-10-07 DIAGNOSIS — G8929 Other chronic pain: Secondary | ICD-10-CM | POA: Diagnosis not present

## 2019-10-07 DIAGNOSIS — M25512 Pain in left shoulder: Secondary | ICD-10-CM | POA: Diagnosis not present

## 2019-10-14 DIAGNOSIS — M25512 Pain in left shoulder: Secondary | ICD-10-CM | POA: Diagnosis not present

## 2019-10-14 DIAGNOSIS — G8929 Other chronic pain: Secondary | ICD-10-CM | POA: Diagnosis not present

## 2019-10-16 DIAGNOSIS — M25512 Pain in left shoulder: Secondary | ICD-10-CM | POA: Diagnosis not present

## 2019-10-16 DIAGNOSIS — G8929 Other chronic pain: Secondary | ICD-10-CM | POA: Diagnosis not present

## 2019-10-21 DIAGNOSIS — G8929 Other chronic pain: Secondary | ICD-10-CM | POA: Diagnosis not present

## 2019-10-21 DIAGNOSIS — M25512 Pain in left shoulder: Secondary | ICD-10-CM | POA: Diagnosis not present

## 2019-10-28 DIAGNOSIS — G8929 Other chronic pain: Secondary | ICD-10-CM | POA: Diagnosis not present

## 2019-10-28 DIAGNOSIS — M25512 Pain in left shoulder: Secondary | ICD-10-CM | POA: Diagnosis not present

## 2019-10-31 DIAGNOSIS — M21822 Other specified acquired deformities of left upper arm: Secondary | ICD-10-CM | POA: Diagnosis not present

## 2019-10-31 DIAGNOSIS — M25512 Pain in left shoulder: Secondary | ICD-10-CM | POA: Diagnosis not present

## 2019-10-31 DIAGNOSIS — M24412 Recurrent dislocation, left shoulder: Secondary | ICD-10-CM | POA: Diagnosis not present

## 2019-10-31 DIAGNOSIS — G8929 Other chronic pain: Secondary | ICD-10-CM | POA: Diagnosis not present

## 2019-11-03 DIAGNOSIS — R519 Headache, unspecified: Secondary | ICD-10-CM | POA: Diagnosis not present

## 2019-11-03 DIAGNOSIS — H9319 Tinnitus, unspecified ear: Secondary | ICD-10-CM | POA: Diagnosis not present

## 2019-11-03 DIAGNOSIS — R200 Anesthesia of skin: Secondary | ICD-10-CM | POA: Diagnosis not present

## 2019-11-03 DIAGNOSIS — R569 Unspecified convulsions: Secondary | ICD-10-CM | POA: Diagnosis not present

## 2019-11-04 DIAGNOSIS — M25512 Pain in left shoulder: Secondary | ICD-10-CM | POA: Diagnosis not present

## 2019-11-04 DIAGNOSIS — G8929 Other chronic pain: Secondary | ICD-10-CM | POA: Diagnosis not present

## 2019-11-07 DIAGNOSIS — G8929 Other chronic pain: Secondary | ICD-10-CM | POA: Diagnosis not present

## 2019-11-07 DIAGNOSIS — M25512 Pain in left shoulder: Secondary | ICD-10-CM | POA: Diagnosis not present

## 2019-11-10 DIAGNOSIS — G8929 Other chronic pain: Secondary | ICD-10-CM | POA: Diagnosis not present

## 2019-11-10 DIAGNOSIS — M25512 Pain in left shoulder: Secondary | ICD-10-CM | POA: Diagnosis not present

## 2019-11-13 DIAGNOSIS — G8929 Other chronic pain: Secondary | ICD-10-CM | POA: Diagnosis not present

## 2019-11-13 DIAGNOSIS — M25512 Pain in left shoulder: Secondary | ICD-10-CM | POA: Diagnosis not present

## 2019-11-19 DIAGNOSIS — M25512 Pain in left shoulder: Secondary | ICD-10-CM | POA: Diagnosis not present

## 2019-11-19 DIAGNOSIS — G8929 Other chronic pain: Secondary | ICD-10-CM | POA: Diagnosis not present

## 2019-11-21 DIAGNOSIS — G8929 Other chronic pain: Secondary | ICD-10-CM | POA: Diagnosis not present

## 2019-11-21 DIAGNOSIS — M25512 Pain in left shoulder: Secondary | ICD-10-CM | POA: Diagnosis not present

## 2019-11-25 DIAGNOSIS — G8929 Other chronic pain: Secondary | ICD-10-CM | POA: Diagnosis not present

## 2019-11-25 DIAGNOSIS — M25512 Pain in left shoulder: Secondary | ICD-10-CM | POA: Diagnosis not present

## 2019-11-28 DIAGNOSIS — M25512 Pain in left shoulder: Secondary | ICD-10-CM | POA: Diagnosis not present

## 2019-11-28 DIAGNOSIS — G8929 Other chronic pain: Secondary | ICD-10-CM | POA: Diagnosis not present

## 2019-12-02 DIAGNOSIS — M25512 Pain in left shoulder: Secondary | ICD-10-CM | POA: Diagnosis not present

## 2019-12-02 DIAGNOSIS — G8929 Other chronic pain: Secondary | ICD-10-CM | POA: Diagnosis not present

## 2019-12-04 ENCOUNTER — Ambulatory Visit: Payer: 59 | Attending: Internal Medicine

## 2019-12-04 DIAGNOSIS — Z23 Encounter for immunization: Secondary | ICD-10-CM

## 2019-12-04 NOTE — Progress Notes (Signed)
   Covid-19 Vaccination Clinic  Name:  Martin Gonzalez    MRN: [id number] DOB: August 11, 1996  12/04/2019  [name] was observed post Covid-19 immunization for 30 minutes based on pre-vaccination screening without incident. He was provided with Vaccine Information Sheet and instruction to access the V-Safe system.   [name] was instructed to call 911 with any severe reactions post vaccine: Marland Kitchen Difficulty breathing  . Swelling of face and throat  . A fast heartbeat  . A bad rash all over body  . Dizziness and weakness   Immunizations Administered    Name Date Dose VIS Date Route   Pfizer COVID-19 Vaccine 12/04/2019 11:28 AM 0.3 mL 08/22/2019 Intramuscular   Manufacturer: [employer], [employer]   Lot: [id number]   NDC: [address]-2

## 2019-12-10 DIAGNOSIS — G8929 Other chronic pain: Secondary | ICD-10-CM | POA: Diagnosis not present

## 2019-12-10 DIAGNOSIS — M25512 Pain in left shoulder: Secondary | ICD-10-CM | POA: Diagnosis not present

## 2019-12-17 DIAGNOSIS — M25512 Pain in left shoulder: Secondary | ICD-10-CM | POA: Diagnosis not present

## 2019-12-17 DIAGNOSIS — G8929 Other chronic pain: Secondary | ICD-10-CM | POA: Diagnosis not present

## 2019-12-22 MED FILL — levETIRAcetam 500 MG TABS: 500 | 90 days supply | Qty: 180 | Fill #0

## 2019-12-23 DIAGNOSIS — M25512 Pain in left shoulder: Secondary | ICD-10-CM | POA: Diagnosis not present

## 2019-12-23 DIAGNOSIS — G8929 Other chronic pain: Secondary | ICD-10-CM | POA: Diagnosis not present

## 2019-12-29 ENCOUNTER — Ambulatory Visit: Payer: 59 | Attending: Internal Medicine

## 2019-12-29 DIAGNOSIS — M24412 Recurrent dislocation, left shoulder: Secondary | ICD-10-CM | POA: Diagnosis not present

## 2019-12-29 DIAGNOSIS — Z23 Encounter for immunization: Secondary | ICD-10-CM

## 2019-12-29 DIAGNOSIS — M21822 Other specified acquired deformities of left upper arm: Secondary | ICD-10-CM | POA: Diagnosis not present

## 2019-12-29 NOTE — Progress Notes (Signed)
   Covid-19 Vaccination Clinic  Name:  Martin Gonzalez    MRN: [id number] DOB: 1996/07/14  12/29/2019  [name] was observed post Covid-19 immunization for 15 minutes without incident. He was provided with Vaccine Information Sheet and instruction to access the V-Safe system.   [name] was instructed to call 911 with any severe reactions post vaccine: Marland Kitchen Difficulty breathing  . Swelling of face and throat  . A fast heartbeat  . A bad rash all over body  . Dizziness and weakness   Immunizations Administered    Name Date Dose VIS Date Route   Pfizer COVID-19 Vaccine 12/29/2019 11:55 AM 0.3 mL 11/05/2018 Intramuscular   Manufacturer: [employer], [employer]   Lot: [id number]   NDC: [address]-5

## 2020-01-02 DIAGNOSIS — G8929 Other chronic pain: Secondary | ICD-10-CM | POA: Diagnosis not present

## 2020-01-02 DIAGNOSIS — M25512 Pain in left shoulder: Secondary | ICD-10-CM | POA: Diagnosis not present

## 2020-01-07 DIAGNOSIS — G8929 Other chronic pain: Secondary | ICD-10-CM | POA: Diagnosis not present

## 2020-01-07 DIAGNOSIS — M25512 Pain in left shoulder: Secondary | ICD-10-CM | POA: Diagnosis not present

## 2020-01-16 DIAGNOSIS — G8929 Other chronic pain: Secondary | ICD-10-CM | POA: Diagnosis not present

## 2020-01-16 DIAGNOSIS — M25512 Pain in left shoulder: Secondary | ICD-10-CM | POA: Diagnosis not present

## 2020-01-23 DIAGNOSIS — M25512 Pain in left shoulder: Secondary | ICD-10-CM | POA: Diagnosis not present

## 2020-01-23 DIAGNOSIS — G8929 Other chronic pain: Secondary | ICD-10-CM | POA: Diagnosis not present

## 2020-01-23 DIAGNOSIS — G40309 Generalized idiopathic epilepsy and epileptic syndromes, not intractable, without status epilepticus: Secondary | ICD-10-CM | POA: Diagnosis not present

## 2020-01-24 DIAGNOSIS — G40309 Generalized idiopathic epilepsy and epileptic syndromes, not intractable, without status epilepticus: Secondary | ICD-10-CM | POA: Diagnosis not present

## 2020-01-25 DIAGNOSIS — G40309 Generalized idiopathic epilepsy and epileptic syndromes, not intractable, without status epilepticus: Secondary | ICD-10-CM | POA: Diagnosis not present

## 2020-01-28 DIAGNOSIS — G8929 Other chronic pain: Secondary | ICD-10-CM | POA: Diagnosis not present

## 2020-01-28 DIAGNOSIS — M25512 Pain in left shoulder: Secondary | ICD-10-CM | POA: Diagnosis not present

## 2020-02-04 DIAGNOSIS — M25512 Pain in left shoulder: Secondary | ICD-10-CM | POA: Diagnosis not present

## 2020-02-04 DIAGNOSIS — G8929 Other chronic pain: Secondary | ICD-10-CM | POA: Diagnosis not present

## 2020-02-11 DIAGNOSIS — M25512 Pain in left shoulder: Secondary | ICD-10-CM | POA: Diagnosis not present

## 2020-02-11 DIAGNOSIS — G8929 Other chronic pain: Secondary | ICD-10-CM | POA: Diagnosis not present

## 2020-02-18 DIAGNOSIS — M25512 Pain in left shoulder: Secondary | ICD-10-CM | POA: Diagnosis not present

## 2020-02-18 DIAGNOSIS — G8929 Other chronic pain: Secondary | ICD-10-CM | POA: Diagnosis not present

## 2020-02-23 DIAGNOSIS — G40309 Generalized idiopathic epilepsy and epileptic syndromes, not intractable, without status epilepticus: Secondary | ICD-10-CM | POA: Diagnosis not present

## 2020-02-23 DIAGNOSIS — R569 Unspecified convulsions: Secondary | ICD-10-CM | POA: Diagnosis not present

## 2020-02-23 DIAGNOSIS — R519 Headache, unspecified: Secondary | ICD-10-CM | POA: Diagnosis not present

## 2020-02-23 DIAGNOSIS — R200 Anesthesia of skin: Secondary | ICD-10-CM | POA: Diagnosis not present

## 2020-02-24 MED FILL — lamoTRIgine 25 MG TABS: 25 | 30 days supply | Qty: 240 | Fill #0

## 2020-04-16 MED FILL — levETIRAcetam 500 MG TABS: 500 | 90 days supply | Qty: 180 | Fill #1

## 2020-04-19 MED FILL — LAMOTRIGINE 25 MG TABS: 25 | 30 days supply | Qty: 240 | Fill #0

## 2020-05-02 ENCOUNTER — Other Ambulatory Visit: Payer: Self-pay | Admitting: Family Medicine

## 2020-05-02 DIAGNOSIS — Z13220 Encounter for screening for lipoid disorders: Secondary | ICD-10-CM

## 2020-05-02 DIAGNOSIS — G40909 Epilepsy, unspecified, not intractable, without status epilepticus: Secondary | ICD-10-CM

## 2020-05-05 ENCOUNTER — Other Ambulatory Visit (INDEPENDENT_AMBULATORY_CARE_PROVIDER_SITE_OTHER): Payer: 59

## 2020-05-05 ENCOUNTER — Other Ambulatory Visit: Payer: Self-pay

## 2020-05-05 DIAGNOSIS — Z13220 Encounter for screening for lipoid disorders: Secondary | ICD-10-CM | POA: Diagnosis not present

## 2020-05-05 DIAGNOSIS — G40909 Epilepsy, unspecified, not intractable, without status epilepticus: Secondary | ICD-10-CM | POA: Diagnosis not present

## 2020-05-05 LAB — TSH: TSH: 1.34 u[IU]/mL (ref 0.35–4.50)

## 2020-05-05 LAB — LIPID PANEL
Cholesterol: 143 mg/dL (ref 0–200)
HDL: 64 mg/dL (ref >39.00)
LDL Cholesterol: 70 mg/dL (ref 0–99)
NonHDL: 78.67
Total CHOL/HDL Ratio: 2
Triglycerides: 45 mg/dL (ref 0.0–149.0)
VLDL: 9 mg/dL (ref 0.0–40.0)

## 2020-05-05 LAB — CBC WITH DIFFERENTIAL/PLATELET
Basophils Absolute: 0 10*3/uL (ref 0.0–0.1)
Basophils Relative: 0.2 % (ref 0.0–3.0)
Eosinophils Absolute: 0 10*3/uL (ref 0.0–0.7)
Eosinophils Relative: 0.7 % (ref 0.0–5.0)
HCT: 42.9 % (ref 39.0–52.0)
Hemoglobin: 14.4 g/dL (ref 13.0–17.0)
Lymphocytes Relative: 47.3 % — ABNORMAL HIGH (ref 12.0–46.0)
Lymphs Abs: 2.7 10*3/uL (ref 0.7–4.0)
MCHC: 33.5 g/dL (ref 30.0–36.0)
MCV: 94.2 fl (ref 78.0–100.0)
Monocytes Absolute: 0.6 10*3/uL (ref 0.1–1.0)
Monocytes Relative: 9.8 % (ref 3.0–12.0)
Neutro Abs: 2.4 10*3/uL (ref 1.4–7.7)
Neutrophils Relative %: 42 % — ABNORMAL LOW (ref 43.0–77.0)
Platelets: 200 10*3/uL (ref 150.0–400.0)
RBC: 4.55 Mil/uL (ref 4.22–5.81)
RDW: 13 % (ref 11.5–15.5)
WBC: 5.8 10*3/uL (ref 4.0–10.5)

## 2020-05-05 LAB — COMPREHENSIVE METABOLIC PANEL
ALT: 14 U/L (ref 0–53)
AST: 15 U/L (ref 0–37)
Albumin: 4.6 g/dL (ref 3.5–5.2)
Alkaline Phosphatase: 60 U/L (ref 39–117)
BUN: 10 mg/dL (ref 6–23)
CO2: 33 mEq/L — ABNORMAL HIGH (ref 19–32)
Calcium: 9.9 mg/dL (ref 8.4–10.5)
Chloride: 102 mEq/L (ref 96–112)
Creatinine, Ser: 1.3 mg/dL (ref 0.40–1.50)
GFR: 82.37 mL/min (ref >60.00)
Glucose, Bld: 96 mg/dL (ref 70–99)
Potassium: 3.8 mEq/L (ref 3.5–5.1)
Sodium: 141 mEq/L (ref 135–145)
Total Bilirubin: 0.4 mg/dL (ref 0.2–1.2)
Total Protein: 7 g/dL (ref 6.0–8.3)

## 2020-05-07 MED FILL — lamoTRIgine 150 MG TABS: 150 | 30 days supply | Qty: 60 | Fill #0

## 2020-05-12 ENCOUNTER — Other Ambulatory Visit: Payer: Self-pay

## 2020-05-12 ENCOUNTER — Encounter: Payer: Self-pay | Admitting: Family Medicine

## 2020-05-12 ENCOUNTER — Ambulatory Visit (INDEPENDENT_AMBULATORY_CARE_PROVIDER_SITE_OTHER): Payer: 59 | Admitting: Family Medicine

## 2020-05-12 VITALS — BP 124/80 | HR 91 | Temp 98.0°F | Ht 69.88 in | Wt 145.2 lb

## 2020-05-12 DIAGNOSIS — N476 Balanoposthitis: Secondary | ICD-10-CM

## 2020-05-12 DIAGNOSIS — Z23 Encounter for immunization: Secondary | ICD-10-CM

## 2020-05-12 DIAGNOSIS — Z113 Encounter for screening for infections with a predominantly sexual mode of transmission: Secondary | ICD-10-CM | POA: Diagnosis not present

## 2020-05-12 DIAGNOSIS — M24412 Recurrent dislocation, left shoulder: Secondary | ICD-10-CM | POA: Diagnosis not present

## 2020-05-12 DIAGNOSIS — M25531 Pain in right wrist: Secondary | ICD-10-CM | POA: Diagnosis not present

## 2020-05-12 DIAGNOSIS — G40909 Epilepsy, unspecified, not intractable, without status epilepticus: Secondary | ICD-10-CM

## 2020-05-12 DIAGNOSIS — Z0000 Encounter for general adult medical examination without abnormal findings: Secondary | ICD-10-CM | POA: Diagnosis not present

## 2020-05-12 LAB — POCT URINALYSIS DIP (CLINITEK)
Bilirubin, UA: NEGATIVE
Blood, UA: NEGATIVE
Glucose, UA: NEGATIVE mg/dL
Ketones, POC UA: NEGATIVE mg/dL
Leukocytes, UA: NEGATIVE
Nitrite, UA: NEGATIVE
POC PROTEIN,UA: NEGATIVE
Spec Grav, UA: 1.015 (ref 1.010–1.025)
Urobilinogen, UA: 0.2 E.U./dL
pH, UA: 6 (ref 5.0–8.0)

## 2020-05-12 MED ORDER — CLOTRIMAZOLE 1 % EX CREA: 1.0000 "application " | TOPICAL_CREAM | Freq: Two times a day (BID) | CUTANEOUS | 0 refills | Status: DC

## 2020-05-12 NOTE — Progress Notes (Signed)
Patient presenting with right sided wrist pain, pain has been on and off for the last 6 months. Thus flared up last night to pain 7-8/10. Pain is now 1/10. No known injury.

## 2020-05-12 NOTE — Patient Instructions (Addendum)
Tdap today (tetanus and whooping cough) Urine test today.  May use lotrimin antifungal to affected area on privates.  Wrist is looking ok - may use voltaren gel to wrist   Health Maintenance, Male Adopting a healthy lifestyle and getting preventive care are important in promoting health and wellness. Ask your health care provider about:  The right schedule for you to have regular tests and exams.  Things you can do on your own to prevent diseases and keep yourself healthy. What should I know about diet, weight, and exercise? Eat a healthy diet   Eat a diet that includes plenty of vegetables, fruits, low-fat dairy products, and lean protein.  Do not eat a lot of foods that are high in solid fats, added sugars, or sodium. Maintain a healthy weight Body mass index (BMI) is a measurement that can be used to identify possible weight problems. It estimates body fat based on height and weight. Your health care provider can help determine your BMI and help you achieve or maintain a healthy weight. Get regular exercise Get regular exercise. This is one of the most important things you can do for your health. Most adults should:  Exercise for at least 150 minutes each week. The exercise should increase your heart rate and make you sweat (moderate-intensity exercise).  Do strengthening exercises at least twice a week. This is in addition to the moderate-intensity exercise.  Spend less time sitting. Even light physical activity can be beneficial. Watch cholesterol and blood lipids Have your blood tested for lipids and cholesterol at 23 years of age, then have this test every 5 years. You may need to have your cholesterol levels checked more often if:  Your lipid or cholesterol levels are high.  You are older than 23 years of age.  You are at high risk for heart disease. What should I know about cancer screening? Many types of cancers can be detected early and may often be prevented.  Depending on your health history and family history, you may need to have cancer screening at various ages. This may include screening for:  Colorectal cancer.  Prostate cancer.  Skin cancer.  Lung cancer. What should I know about heart disease, diabetes, and high blood pressure? Blood pressure and heart disease  High blood pressure causes heart disease and increases the risk of stroke. This is more likely to develop in people who have high blood pressure readings, are of African descent, or are overweight.  Talk with your health care provider about your target blood pressure readings.  Have your blood pressure checked: ? Every 3-5 years if you are 23-23 years of age. ? Every year if you are 23 years old or older.  If you are between the ages of 23 and 23 and are a current or former smoker, ask your health care provider if you should have a one-time screening for abdominal aortic aneurysm (AAA). Diabetes Have regular diabetes screenings. This checks your fasting blood sugar level. Have the screening done:  Once every three years after age 23 if you are at a normal weight and have a low risk for diabetes.  More often and at a younger age if you are overweight or have a high risk for diabetes. What should I know about preventing infection? Hepatitis B If you have a higher risk for hepatitis B, you should be screened for this virus. Talk with your health care provider to find out if you are at risk for hepatitis B infection. Hepatitis  C Blood testing is recommended for:  Everyone born from [birth year] through 1964.  Anyone with known risk factors for hepatitis C. Sexually transmitted infections (STIs)  You should be screened each year for STIs, including gonorrhea and chlamydia, if: ? You are sexually active and younger than 23 years of age are younger than 23 years of age. ? You are older than 23 years of age and your health care provider tells you that you are at risk for this type of infection. ? Your sexual  activity has changed since you were last screened, and you are at increased risk for chlamydia or gonorrhea. Ask your health care provider if you are at risk.  Ask your health care provider about whether you are at high risk for HIV. Your health care provider may recommend a prescription medicine to help prevent HIV infection. If you choose to take medicine to prevent HIV, you should first get tested for HIV. You should then be tested every 3 months for as long as you are taking the medicine. Follow these instructions at home: Lifestyle  Do not use any products that contain nicotine or tobacco, such as cigarettes, e-cigarettes, and chewing tobacco. If you need help quitting, ask your health care provider.  Do not use street drugs.  Do not share needles.  Ask your health care provider for help if you need support or information about quitting drugs. Alcohol use  Do not drink alcohol if your health care provider tells you not to drink.  If you drink alcohol: ? Limit how much you have to 0-2 drinks a day. ? Be aware of how much alcohol is in your drink. In the [place] one drink equals one 12 oz bottle of beer (355 mL), one 5 oz glass of wine (148 mL), or one 1 oz glass of hard liquor (44 mL). General instructions  Schedule regular health, dental, and eye exams.  Stay current with your vaccines.  Tell your health care provider if: ? You often feel depressed. ? You have ever been abused or do not feel safe at home. Summary  Adopting a healthy lifestyle and getting preventive care are important in promoting health and wellness.  Follow your health care provider's instructions about healthy diet, exercising, and getting tested or screened for diseases.  Follow your health care provider's instructions on monitoring your cholesterol and blood pressure. This information is not intended to replace advice given to you by your health care provider. Make sure you discuss any questions you have  with your health care provider. Document Revised: 08/21/2018 Document Reviewed: 08/21/2018 Elsevier Patient Education  2020 [employer].

## 2020-05-12 NOTE — Progress Notes (Signed)
This visit was conducted in person.  BP 124/80   Pulse 91   Temp 98 F (36.7 C) (Temporal)   Ht 5' 9.88" (1.775 m)   Wt 145 lb 3.2 oz (65.9 kg)   SpO2 98%   BMI 20.90 kg/m    CC: CPE Subjective:    Patient ID: Martin Gonzalez, male    DOB: [birth year], 24 y.o.   MRN: [id number]  HPI: Martin Gonzalez is a 24 y.o. male presenting on 05/12/2020 for Annual Exam and Wrist Pain   Seizure disorder on keppra 500mg  bid with h/o recurrent L shoulder dislocation s/p Bankart repair 07/2019. Last seizure was 07/2018. Planning to taper from keppra to lamictal over next few weeks. High side effects on keppra. Followed by Dr 08/2018.   R arm feels fatigued. R shoulder pain initially that since has resolved. R wrist pain off and on over the past 6 months, acutely worsening. Denies inciting trauma/injury or falls.   Preventative: Seat belt use discussed Sunscreen use discussed, no changing moles on skin.  Flu shot yearly  COVID vaccine - Pfizer 11/2019,12/2019 Tetanus - unsure  Smoking - off and on  Alcohol - rare MJ - daily Rec drugs - none  Dentist q6 mo Eye exam yearly - has glasses Currently sexually active - 1 partner in past, monogamous. Partner on [facility]. No concern with STDs but agrees to testing. Notes difficulty fully retracting foreskin, incomplete urinary emptying. Declines blood tests however.   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 Interim medical history since our last visit reviewed. Allergies and medications reviewed and updated. Outpatient Medications Prior to Visit  Medication Sig Dispense Refill  . levETIRAcetam (KEPPRA) 500 MG tablet Take 1 tablet  (500 mg total) by mouth 2 (two) times daily. 60 tablet 1  . Multiple Vitamins-Minerals (EMERGEN-C IMMUNE PO) Take 1 packet by mouth daily as needed (cold symptoms).     [facility] ibuprofen (ADVIL) 200 MG tablet Take 400 mg by mouth every 6 (six) hours as needed. (Patient not taking: Reported on 05/12/2020)    . loratadine (CLARITIN) 10 MG tablet Take 10 mg by mouth daily as needed for allergies. (Patient not taking: Reported on 05/12/2020)    . oxyCODONE (ROXICODONE) 5 MG immediate release tablet Take 1-2 tablets (5-10 mg total) by mouth every 4 (four) hours as needed for moderate pain or severe pain. (Patient not taking: Reported on 05/12/2020) 50 tablet 0   No facility-administered medications prior to visit.     Per HPI unless specifically indicated in ROS section below Review of Systems  Constitutional: Positive for appetite change (weight loss noted) and unexpected weight change. Negative for activity change, chills, fatigue and fever.  HENT: Positive for nosebleeds (infrequent). Negative for hearing loss.   Eyes: Negative for visual disturbance.  Respiratory: Negative for cough, chest tightness, shortness of breath and wheezing.   Cardiovascular: Positive for palpitations. Negative for chest pain and leg swelling.  Gastrointestinal: Negative for abdominal distention, abdominal pain, blood in stool, constipation, diarrhea, nausea and vomiting.  Genitourinary: Negative for difficulty urinating and hematuria.  Musculoskeletal: Negative for arthralgias, myalgias and neck pain.  Skin: Negative for rash.  Neurological: Negative for  dizziness, seizures, syncope and headaches.  Hematological: Negative for adenopathy. Does not bruise/bleed easily.  Psychiatric/Behavioral: Positive for dysphoric mood. The patient is nervous/anxious.    Objective:  BP 124/80   Pulse 91   Temp 98 F (36.7 C) (Temporal)   Ht 5' 9.88" (1.775 m)   Wt 145 lb 3.2 oz (65.9 kg)   SpO2 98%   BMI 20.90 kg/m   Wt Readings from  Last 3 Encounters:  05/12/20 145 lb 3.2 oz (65.9 kg)  07/31/19 157 lb 3 oz (71.3 kg)  04/16/19 155 lb (70.3 kg)      Physical Exam Vitals and nursing note reviewed.  Constitutional:      General: He is not in acute distress.    Appearance: Normal appearance. He is well-developed. He is not ill-appearing.  HENT:     Head: Normocephalic and atraumatic.     Right Ear: Hearing, tympanic membrane, ear canal and external ear normal.     Left Ear: Hearing, tympanic membrane, ear canal and external ear normal.  Eyes:     General: No scleral icterus.    Extraocular Movements: Extraocular movements intact.     Conjunctiva/sclera: Conjunctivae normal.     Pupils: Pupils are equal, round, and reactive to light.  Neck:     Thyroid: No thyroid mass, thyromegaly or thyroid tenderness.  Cardiovascular:     Rate and Rhythm: Normal rate and regular rhythm.     Pulses: Normal pulses.          Radial pulses are 2+ on the right side and 2+ on the left side.     Heart sounds: Normal heart sounds. No murmur heard.   Pulmonary:     Effort: Pulmonary effort is normal. No respiratory distress.     Breath sounds: Normal breath sounds. No wheezing, rhonchi or rales.  Abdominal:     General: Abdomen is flat. Bowel sounds are normal. There is no distension.     Palpations: Abdomen is soft. There is no mass.     Tenderness: There is no abdominal tenderness. There is no guarding or rebound.     Hernia: No hernia is present.  Genitourinary:    Pubic Area: No rash.      Penis: Uncircumcised. Tenderness present. No phimosis, paraphimosis, hypospadias or discharge.      Testes: Normal.  Musculoskeletal:        General: Normal range of motion.     Cervical back: Normal range of motion and neck supple.     Right lower leg: No edema.     Left lower leg: No edema.     Comments:  FROM bilateral wrists.  No pain to palpation  2+ rad pulses bilaterally Strength at wrists and hands WNL, grip strength     Lymphadenopathy:     Cervical: No cervical adenopathy.  Skin:    General: Skin is warm and dry.     Findings: No rash.  Neurological:     General: No focal deficit present.     Mental Status: He is alert and oriented to person, place, and time.     Comments: CN grossly intact, station and gait intact  Psychiatric:        Mood and Affect: Mood normal.        Behavior: Behavior normal.        Thought Content: Thought content normal.        Judgment: Judgment normal.       Results for orders placed or  performed in visit on 05/12/20  C. trachomatis/N. gonorrhoeae RNA   Specimen: Urine  Result Value Ref Range   C. trachomatis RNA, TMA NOT DETECTED NOT DETECT   N. gonorrhoeae RNA, TMA NOT DETECTED NOT DETECT  POCT URINALYSIS DIP (CLINITEK)  Result Value Ref Range   Color, UA yellow yellow   Clarity, UA clear clear   Glucose, UA negative negative mg/dL   Bilirubin, UA negative negative   Ketones, POC UA negative negative mg/dL   Spec Grav, UA 6.812 7.517 - 1.025   Blood, UA negative negative   pH, UA 6.0 5.0 - 8.0   POC PROTEIN,UA negative negative, trace   Urobilinogen, UA 0.2 0.2 or 1.0 E.U./dL   Nitrite, UA Negative Negative   Leukocytes, UA Negative Negative   Assessment & Plan:  This visit occurred during the SARS-CoV-2 public health emergency.  Safety protocols were in place, including screening questions prior to the visit, additional usage of staff PPE, and extensive cleaning of exam room while observing appropriate contact time as indicated for disinfecting solutions.   Problem List Items Addressed This Visit    Seizure disorder (HCC)    Continues keppra 500mg  bid, followed by [facility] neurology.  Planning to transition to lamictal due to concern over mood side effects from keppra.  Last seizure was 07/2018.       Right wrist pain    Points to [facility] however normal exam. Suggested voltaren topically PRN discomfort.       Recurrent shoulder dislocation, left    S/p  Bankart repair 07/2019 without recurrent seizure.       Health maintenance examination - Primary    Preventative protocols reviewed and updated unless pt declined. Discussed healthy diet and lifestyle.  Tdap today.       Balanoposthitis    Check UA. Reviewed foreskin hygiene. Will treat for possible yeast balanoposthitis with lotrimin. Update if not improved with this.       Relevant Orders   POCT URINALYSIS DIP (CLINITEK) (Completed)    Other Visit Diagnoses    Screen for STD (sexually transmitted disease)       Relevant Orders   C. trachomatis/N. gonorrhoeae RNA (Completed)   Need for Tdap vaccination       Relevant Orders   Tdap vaccine greater than or equal to 7yo IM (Completed)       Meds ordered this encounter  Medications  . clotrimazole (LOTRIMIN AF) 1 % cream    Sig: Apply 1 application topically 2 (two) times daily.    Dispense:  30 g    Refill:  0   Orders Placed This Encounter  Procedures  . C. trachomatis/N. gonorrhoeae RNA  . Tdap vaccine greater than or equal to 7yo IM  . POCT URINALYSIS DIP (CLINITEK)    Patient instructions: Tdap today (tetanus and whooping cough) Urine test today.  May use lotrimin antifungal to affected area on privates.  Wrist is looking ok - may use voltaren gel to wrist   Follow up plan: Return if symptoms worsen or fail to improve.  08/2019, MD

## 2020-05-13 DIAGNOSIS — M25531 Pain in right wrist: Secondary | ICD-10-CM | POA: Insufficient documentation

## 2020-05-13 DIAGNOSIS — N476 Balanoposthitis: Secondary | ICD-10-CM | POA: Insufficient documentation

## 2020-05-13 LAB — C. TRACHOMATIS/N. GONORRHOEAE RNA
C. trachomatis RNA, TMA: NOT DETECTED
N. gonorrhoeae RNA, TMA: NOT DETECTED

## 2020-05-13 NOTE — Assessment & Plan Note (Addendum)
Check UA. Reviewed foreskin hygiene. Will treat for possible yeast balanoposthitis with lotrimin. Update if not improved with this.

## 2020-05-13 NOTE — Assessment & Plan Note (Signed)
Points to TF[facility] however normal exam. Suggested voltaren topically PRN discomfort.

## 2020-05-13 NOTE — Assessment & Plan Note (Signed)
S/p Bankart repair 07/2019 without recurrent seizure.

## 2020-05-13 NOTE — Assessment & Plan Note (Signed)
Continues keppra 500mg  bid, followed by [facility] neurology.  Planning to transition to lamictal due to concern over mood side effects from keppra.  Last seizure was 07/2018.

## 2020-05-13 NOTE — Assessment & Plan Note (Signed)
Preventative protocols reviewed and updated unless pt declined. Discussed healthy diet and lifestyle.  Tdap today. 

## 2020-05-31 DIAGNOSIS — M25511 Pain in right shoulder: Secondary | ICD-10-CM | POA: Diagnosis not present

## 2020-05-31 DIAGNOSIS — M24412 Recurrent dislocation, left shoulder: Secondary | ICD-10-CM | POA: Diagnosis not present

## 2020-05-31 DIAGNOSIS — S42022P Displaced fracture of shaft of left clavicle, subsequent encounter for fracture with malunion: Secondary | ICD-10-CM | POA: Diagnosis not present

## 2020-05-31 DIAGNOSIS — M21822 Other specified acquired deformities of left upper arm: Secondary | ICD-10-CM | POA: Diagnosis not present

## 2020-06-03 ENCOUNTER — Other Ambulatory Visit: Payer: Self-pay | Admitting: Surgery

## 2020-06-03 DIAGNOSIS — M21822 Other specified acquired deformities of left upper arm: Secondary | ICD-10-CM

## 2020-06-03 DIAGNOSIS — M24412 Recurrent dislocation, left shoulder: Secondary | ICD-10-CM

## 2020-06-03 DIAGNOSIS — S42022P Displaced fracture of shaft of left clavicle, subsequent encounter for fracture with malunion: Secondary | ICD-10-CM

## 2020-06-03 MED FILL — lamoTRIgine 150 MG TABS: 150 | 30 days supply | Qty: 60 | Fill #1

## 2020-06-08 DIAGNOSIS — Z79899 Other long term (current) drug therapy: Secondary | ICD-10-CM | POA: Diagnosis not present

## 2020-06-08 DIAGNOSIS — R569 Unspecified convulsions: Secondary | ICD-10-CM | POA: Diagnosis not present

## 2020-06-08 DIAGNOSIS — R519 Headache, unspecified: Secondary | ICD-10-CM | POA: Diagnosis not present

## 2020-06-08 DIAGNOSIS — H9319 Tinnitus, unspecified ear: Secondary | ICD-10-CM | POA: Diagnosis not present

## 2020-06-09 ENCOUNTER — Ambulatory Visit
Admission: RE | Admit: 2020-06-09 | Discharge: 2020-06-09 | Disposition: A | Discharge status: Home / Self Care | Payer: 59 | Source: Ambulatory Visit | Attending: Surgery | Admitting: Surgery

## 2020-06-09 ENCOUNTER — Other Ambulatory Visit: Payer: Self-pay

## 2020-06-09 DIAGNOSIS — S42022P Displaced fracture of shaft of left clavicle, subsequent encounter for fracture with malunion: Secondary | ICD-10-CM | POA: Diagnosis not present

## 2020-06-09 DIAGNOSIS — M24412 Recurrent dislocation, left shoulder: Secondary | ICD-10-CM | POA: Diagnosis not present

## 2020-06-09 DIAGNOSIS — M21822 Other specified acquired deformities of left upper arm: Secondary | ICD-10-CM | POA: Insufficient documentation

## 2020-06-09 DIAGNOSIS — S43005D Unspecified dislocation of left shoulder joint, subsequent encounter: Secondary | ICD-10-CM | POA: Diagnosis not present

## 2020-06-28 ENCOUNTER — Telehealth: Payer: Self-pay

## 2020-06-28 ENCOUNTER — Encounter: Payer: Self-pay | Admitting: Family Medicine

## 2020-06-28 ENCOUNTER — Other Ambulatory Visit: Payer: Self-pay

## 2020-06-28 ENCOUNTER — Ambulatory Visit: Payer: 59 | Admitting: Family Medicine

## 2020-06-28 VITALS — BP 122/88 | HR 95 | Temp 97.6°F | Ht 69.88 in | Wt 149.7 lb

## 2020-06-28 DIAGNOSIS — Z113 Encounter for screening for infections with a predominantly sexual mode of transmission: Secondary | ICD-10-CM

## 2020-06-28 DIAGNOSIS — Z23 Encounter for immunization: Secondary | ICD-10-CM | POA: Diagnosis not present

## 2020-06-28 DIAGNOSIS — R369 Urethral discharge, unspecified: Secondary | ICD-10-CM | POA: Diagnosis not present

## 2020-06-28 NOTE — Telephone Encounter (Signed)
Patient says he requested Claritin and did not know if the provider prescribed any. Pharmacy is the [facility] outpatient pharmacy in [place]

## 2020-06-28 NOTE — Assessment & Plan Note (Signed)
Overall normal exam without signs of balanitis, phimosis or yeast infection, although there is some clear discharge - check urine chlamydia/gonorrhea, add trichomonas. Consider empiric treatment if all negative. Consider referral to urology for further evaluation of incomplete urination and ejaculation.  He will return for lab work for HIV/RPR screening.

## 2020-06-28 NOTE — Progress Notes (Signed)
This visit was conducted in person.  BP 122/88 (BP Location: Left Arm, Patient Position: Sitting)   Pulse 95   Temp 97.6 F (36.4 C)   Ht 5' 9.88" (1.775 m)   Wt 149 lb 11.2 oz (67.9 kg)   SpO2 97%   BMI 21.55 kg/m    CC: check rash Subjective:    Patient ID: Martin Gonzalez, male    DOB: 01-14-1996, 24 y.o.   MRN: [id number]  HPI: Martin Gonzalez is a 24 y.o. male presenting on 06/28/2020 for Follow-up   Months of penile discomfort, inflammation, some dysuria and urethral discharge every few weeks. Some incomplete emptying with urination and with ejaculation. Has trouble with full retraction of foreskin over head of penis.   Denies rash. No fevers/chills, abd pain, nausea, diarrhea/constipation, hematuria.   No benefit with lotrimin cream despite use for the past month.   No new sexual partners.  Sexually active with 1 partner - monogamous relationship. Partner has noted she has discharge as well.   CT/GC check and UA normal 05/2020.   H/o seizures. Fully off keppra, now only on lamictal 150mg  bid.      Relevant past medical, surgical, family and social history reviewed and updated as indicated. Interim medical history since our last visit reviewed. Allergies and medications reviewed and updated. Outpatient Medications Prior to Visit  Medication Sig Dispense Refill  . clotrimazole (LOTRIMIN AF) 1 % cream Apply 1 application topically 2 (two) times daily. 30 g 0  . ibuprofen (ADVIL) 200 MG tablet Take 400 mg by mouth every 6 (six) hours as needed.     . lamoTRIgine (LAMICTAL) 150 MG tablet Take 150 mg by mouth 2 (two) times daily.    loratadine (CLARITIN) 10 MG tablet Take 10 mg by mouth daily as needed for allergies.     . Multiple Vitamins-Minerals (EMERGEN-C IMMUNE PO) Take 1 packet by mouth daily as needed (cold symptoms).     [facility] [employer] levETIRAcetam (KEPPRA) 500 MG tablet Take 1 tablet (500 mg total) by mouth 2 (two) times daily. 60 tablet 1   No facility-administered  medications prior to visit.     Per HPI unless specifically indicated in ROS section below Review of Systems Objective:  BP 122/88 (BP Location: Left Arm, Patient Position: Sitting)   Pulse 95   Temp 97.6 F (36.4 C)   Ht 5' 9.88" (1.775 m)   Wt 149 lb 11.2 oz (67.9 kg)   SpO2 97%   BMI 21.55 kg/m   Wt Readings from Last 3 Encounters:  06/28/20 149 lb 11.2 oz (67.9 kg)  05/12/20 145 lb 3.2 oz (65.9 kg)  07/31/19 157 lb 3 oz (71.3 kg)      Physical Exam Vitals and nursing note reviewed.  Constitutional:      Appearance: Normal appearance. He is not ill-appearing.  Abdominal:     Hernia: There is no hernia in the left inguinal area or right inguinal area.  Genitourinary:    Pubic Area: No rash.      Penis: Uncircumcised. Discharge (mild clear) present. No phimosis, paraphimosis, erythema, tenderness or swelling.      Testes: Normal.        Right: Mass or tenderness not present.        Left: Mass or tenderness not present.  Neurological:     Mental Status: He is alert.       Results for orders placed or performed in visit on 05/12/20  C. trachomatis/N. gonorrhoeae RNA  Specimen: Urine  Result Value Ref Range   C. trachomatis RNA, TMA NOT DETECTED NOT DETECT   N. gonorrhoeae RNA, TMA NOT DETECTED NOT DETECT  POCT URINALYSIS DIP (CLINITEK)  Result Value Ref Range   Color, UA yellow yellow   Clarity, UA clear clear   Glucose, UA negative negative mg/dL   Bilirubin, UA negative negative   Ketones, POC UA negative negative mg/dL   Spec Grav, UA 6.606 3.016 - 1.025   Blood, UA negative negative   pH, UA 6.0 5.0 - 8.0   POC PROTEIN,UA negative negative, trace   Urobilinogen, UA 0.2 0.2 or 1.0 E.U./dL   Nitrite, UA Negative Negative   Leukocytes, UA Negative Negative   Assessment & Plan:  This visit occurred during the SARS-CoV-2 public health emergency.  Safety protocols were in place, including screening questions prior to the visit, additional usage of staff PPE,  and extensive cleaning of exam room while observing appropriate contact time as indicated for disinfecting solutions.   Problem List Items Addressed This Visit    Urethral discharge in male - Primary    Overall normal exam without signs of balanitis, phimosis or yeast infection, although there is some clear discharge - check urine chlamydia/gonorrhea, add trichomonas. Consider empiric treatment if all negative. Consider referral to urology for further evaluation of incomplete urination and ejaculation.  He will return for lab work for HIV/RPR screening.       Relevant Orders   C. trachomatis/N. gonorrhoeae RNA   Trichomonas vaginalis RNA, Ql,Males    Other Visit Diagnoses    Need for immunization against influenza       Relevant Orders   Flu Vaccine QUAD 36+ mos IM (Completed)   Screen for STD (sexually transmitted disease)       Relevant Orders   HIV Antibody (routine testing w rflx)   RPR   C. trachomatis/N. gonorrhoeae RNA   Trichomonas vaginalis RNA, Ql,Males       No orders of the defined types were placed in this encounter.  Orders Placed This Encounter  Procedures  . C. trachomatis/N. gonorrhoeae RNA  . Flu Vaccine QUAD 36+ mos IM  . HIV Antibody (routine testing w rflx)    Standing Status:   Future    Standing Expiration Date:   06/28/2021  . RPR    Standing Status:   Future    Standing Expiration Date:   06/28/2021    Patient Instructions  Testing for infection done today  We will be in touch with results.  May consider urology evaluation if ongoing symptoms.    Follow up plan: Return if symptoms worsen or fail to improve.  [name], MD

## 2020-06-28 NOTE — Patient Instructions (Signed)
Testing for infection done today  We will be in touch with results.  May consider urology evaluation if ongoing symptoms.

## 2020-06-29 LAB — C. TRACHOMATIS/N. GONORRHOEAE RNA
C. trachomatis RNA, TMA: NOT DETECTED
N. gonorrhoeae RNA, TMA: NOT DETECTED

## 2020-06-29 LAB — TRICHOMONAS VAGINALIS RNA, QL,MALES: Trichomonas vaginalis RNA: NOT DETECTED

## 2020-06-30 NOTE — Telephone Encounter (Signed)
Spoke with pt asking about Claritin rx request.  States he already purchased OTC.

## 2020-07-02 ENCOUNTER — Other Ambulatory Visit: Payer: Self-pay | Admitting: Family Medicine

## 2020-07-02 ENCOUNTER — Other Ambulatory Visit: Payer: Self-pay

## 2020-07-02 MED ORDER — CEFIXIME 400 MG PO CAPS: 800.0000 mg | ORAL_CAPSULE | Freq: Once | ORAL | 0 refills | Status: DC

## 2020-07-02 MED ORDER — DOXYCYCLINE HYCLATE 100 MG PO TABS: 100.0000 mg | ORAL_TABLET | Freq: Two times a day (BID) | ORAL | 0 refills | Status: DC

## 2020-07-02 MED FILL — DOXYCYCLINE HYCLATE 100 MG: 100 | 7 days supply | Qty: 14 | Fill #0

## 2020-07-02 MED FILL — lamoTRIgine 150 MG TABS: 150 | 30 days supply | Qty: 60 | Fill #0

## 2020-07-02 MED FILL — CEFIXIME 400 MG CAPS: 400 | 1 days supply | Qty: 2 | Fill #0

## 2020-07-02 NOTE — Telephone Encounter (Signed)
Call from pt requesting refill for Lamictal.  Last rx: 06/03/20 Last OV:  06/28/20, f/u Next OV:  None;  C/x lab visit scheduled for 07/05/20.

## 2020-07-04 IMAGING — CT CT HEAD W/O CM
3 series · 16 of 47 positions shown, 19 images · non-contrast
Comparison: None.

CLINICAL DATA: Seizure.

EXAM:
CT HEAD WITHOUT CONTRAST
TECHNIQUE: Contiguous axial images were obtained from the base of the skull
through the vertex without intravenous contrast.

[Series 3: head wo · axial · 0.41mm/px · z∈[+309,+434]mm · 10 of 31 slices shown, 13 images]
[im 3/31  brain]
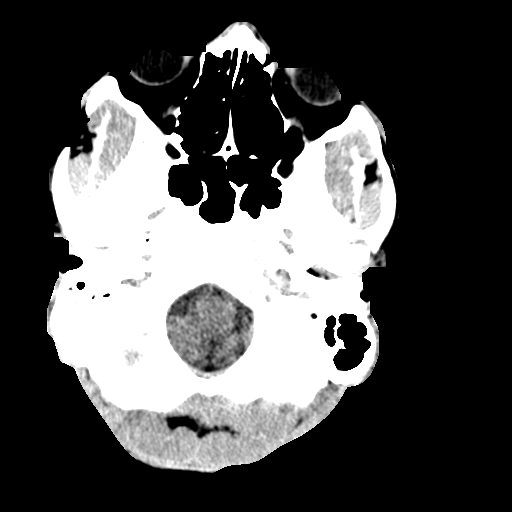
[im 3/31  bone]
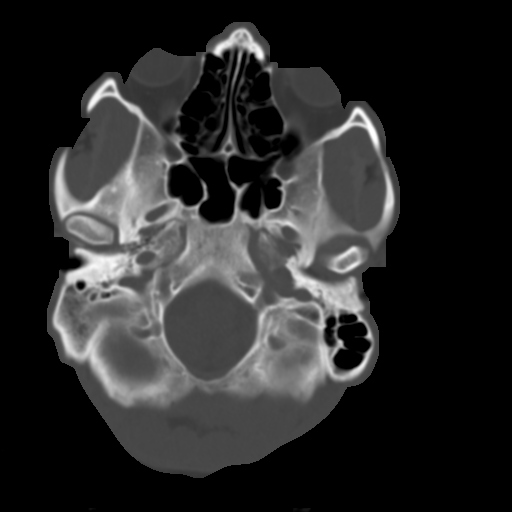
[im 6/31  brain]
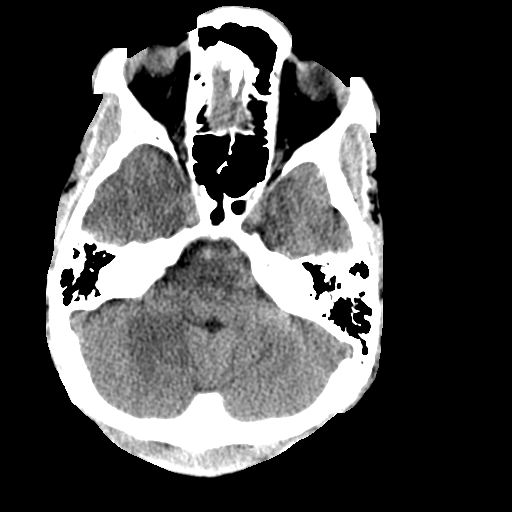
[im 9/31  brain]
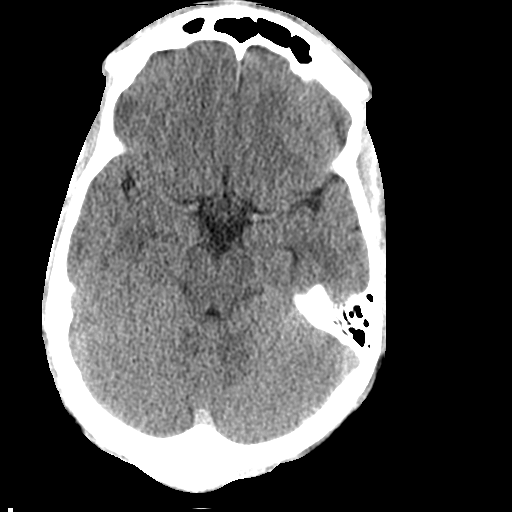
[im 11/31  brain]
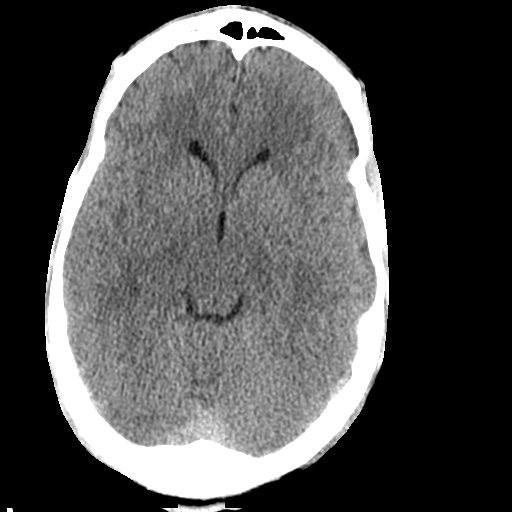
[im 14/31  brain]
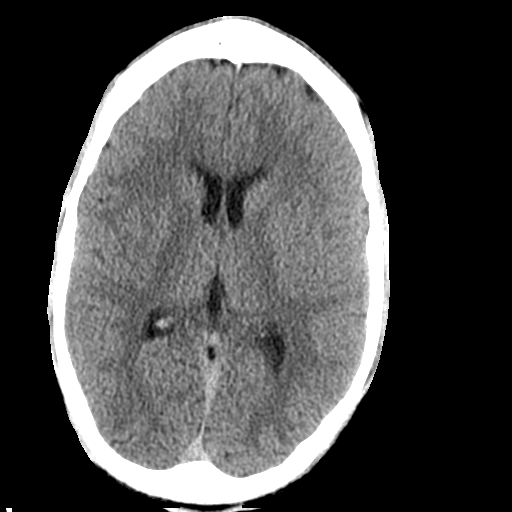
[im 14/31  bone]
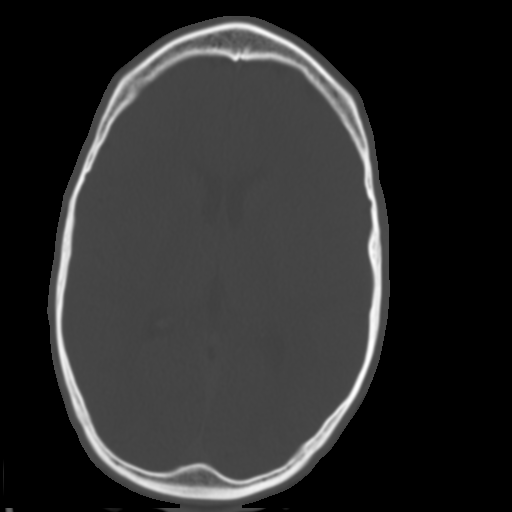
[im 17/31  brain]
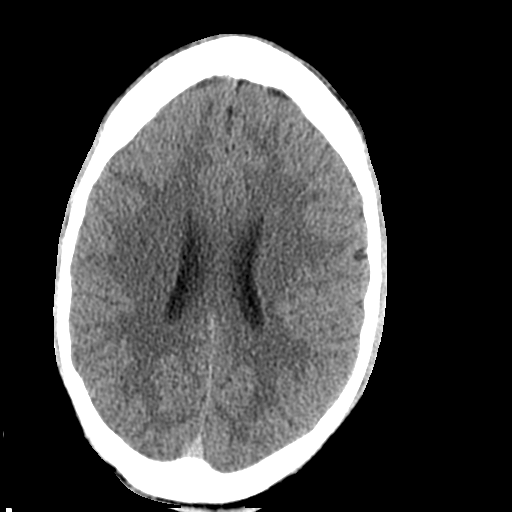
[im 20/31  brain]
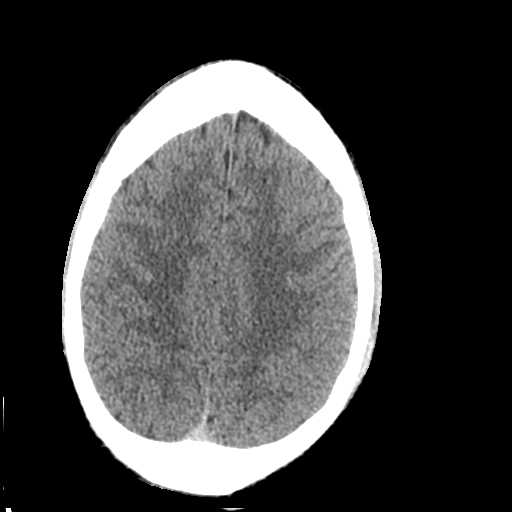
[im 23/31  brain]
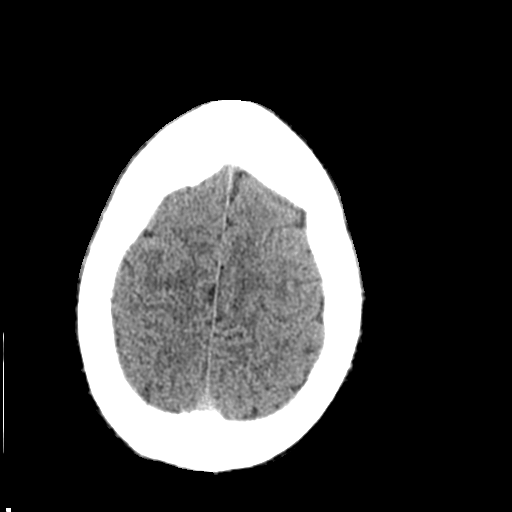
[im 25/31  brain]
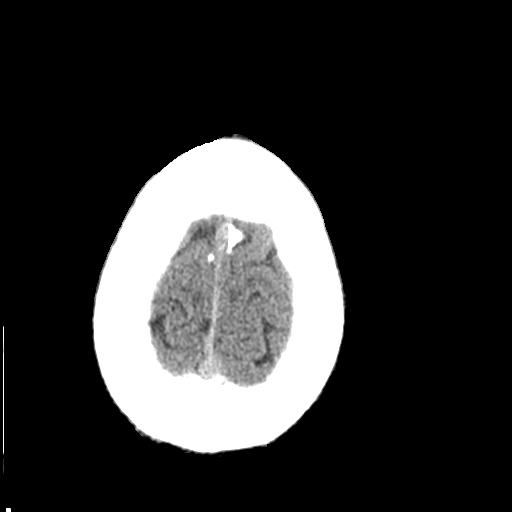
[im 25/31  bone]
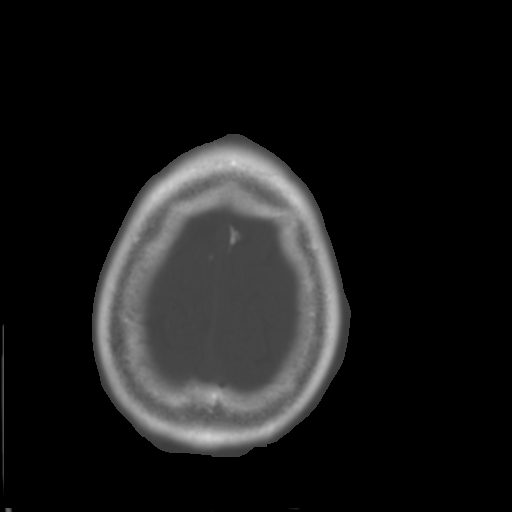
[im 28/31  brain]
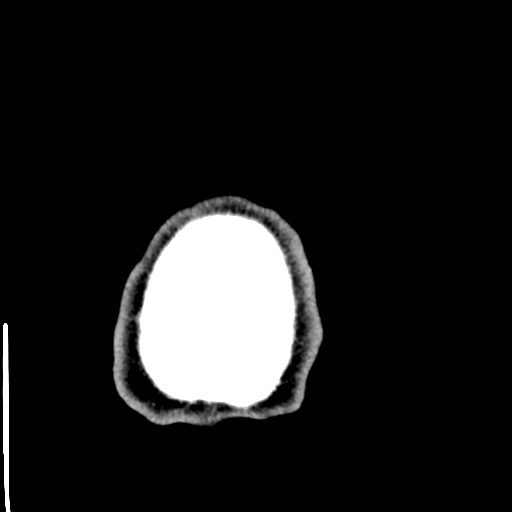

[Series 4: coronal soft tissue · coronal · 0.30mm/px · 3 of 71 slices shown]
[im 24/71  brain]
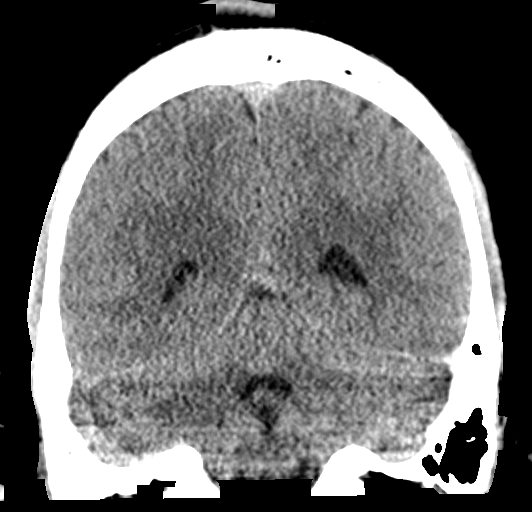
[im 32/71  brain]
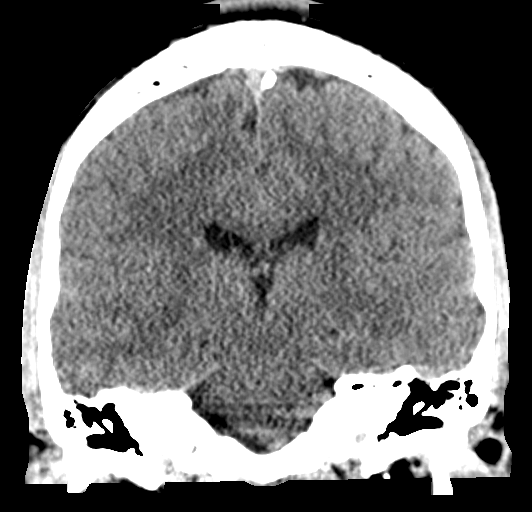
[im 39/71  brain]
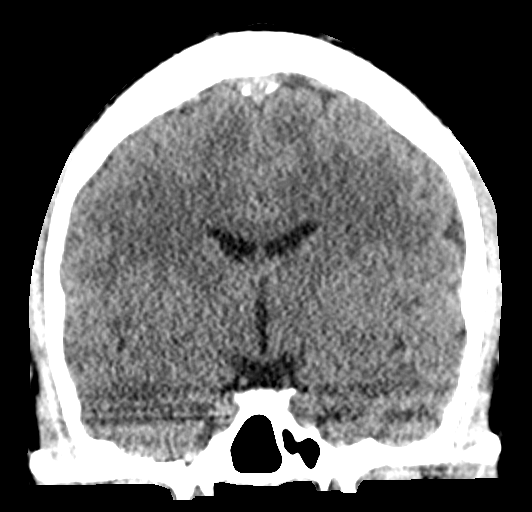

[Series 5: sagittal soft tissue · sagittal · 0.30mm/px · 3 of 53 slices shown]
[im 18/53  brain]
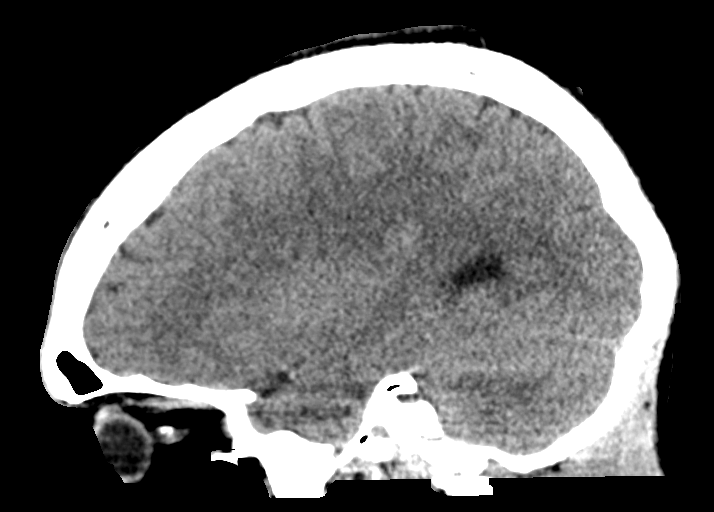
[im 27/53  brain]
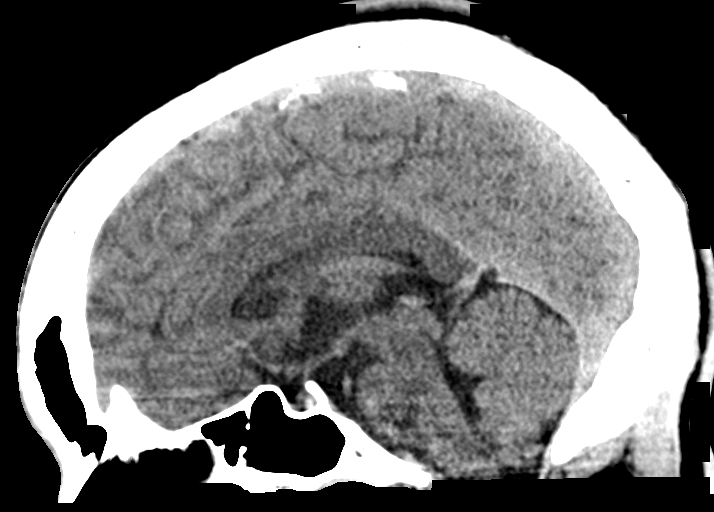
[im 35/53  brain]
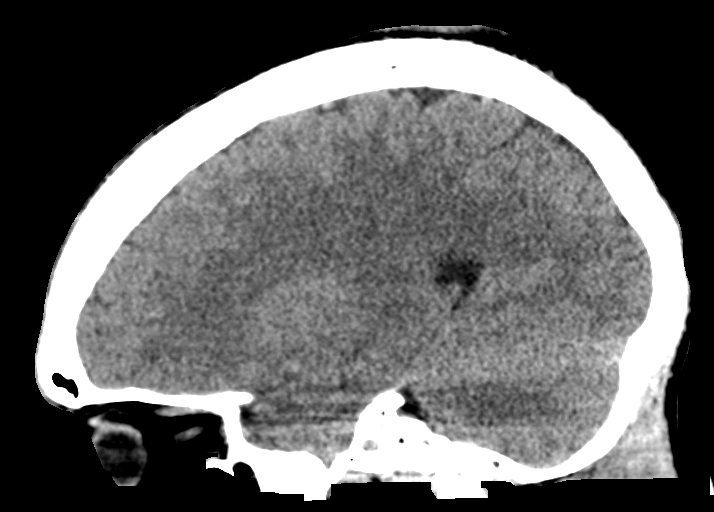

[16 of 47 positions shown; findings below may reference images not displayed]

FINDINGS: Brain: No evidence of acute infarction, hemorrhage, hydrocephalus,
extra-axial collection or mass lesion/mass effect.

Vascular: No hyperdense vessel or unexpected calcification.

Skull: Normal. Negative for fracture or focal lesion.

Sinuses/Orbits: No acute finding.

Other: None.
IMPRESSION: Normal brain.

## 2020-07-04 NOTE — Telephone Encounter (Signed)
Pt gets med through neurology.  He should contact neurology office for refill.

## 2020-07-05 ENCOUNTER — Other Ambulatory Visit: Payer: 59

## 2020-07-05 NOTE — Telephone Encounter (Signed)
Spoke with pt relaying Dr. G's message. Pt verbalizes understanding.  

## 2020-07-19 DIAGNOSIS — S42022P Displaced fracture of shaft of left clavicle, subsequent encounter for fracture with malunion: Secondary | ICD-10-CM | POA: Diagnosis not present

## 2020-07-19 DIAGNOSIS — M21822 Other specified acquired deformities of left upper arm: Secondary | ICD-10-CM | POA: Diagnosis not present

## 2020-07-19 DIAGNOSIS — M24412 Recurrent dislocation, left shoulder: Secondary | ICD-10-CM | POA: Diagnosis not present

## 2020-08-09 MED FILL — lamoTRIgine 150 MG TABS: 150 | 30 days supply | Qty: 60 | Fill #1

## 2020-09-08 MED FILL — lamoTRIgine 150 MG TABS: 150 | 30 days supply | Qty: 60 | Fill #2

## 2020-10-04 MED FILL — lamoTRIgine 150 MG TABS: 150 | 30 days supply | Qty: 60 | Fill #3

## 2020-11-04 MED FILL — levETIRAcetam 500 MG TABS: 500 | 30 days supply | Qty: 60 | Fill #0

## 2020-11-04 MED FILL — lamoTRIgine 150 MG TABS: 150 | 30 days supply | Qty: 60 | Fill #4

## 2020-11-11 ENCOUNTER — Telehealth (INDEPENDENT_AMBULATORY_CARE_PROVIDER_SITE_OTHER): Payer: Self-pay | Admitting: Family Medicine

## 2020-11-11 DIAGNOSIS — J029 Acute pharyngitis, unspecified: Secondary | ICD-10-CM

## 2020-11-11 DIAGNOSIS — R0981 Nasal congestion: Secondary | ICD-10-CM

## 2020-11-11 DIAGNOSIS — R0982 Postnasal drip: Secondary | ICD-10-CM
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
## 2020-11-11 NOTE — Patient Instructions (Signed)
Start Zyrtec and take once daily.  Stop the Claritin.  Start Flonase 2 sprays each nostril daily for the next 2 to 3 weeks.  Schedule a follow-up visit with your primary care office in 2 weeks.  I hope you are feeling better soon!  Seek in person care promptly sooner if your symptoms worsen or new concerns arise.  It was nice to meet you today. I help [facility] out with telemedicine visits on Tuesdays and Thursdays and am available for visits on those days. If you have any concerns or questions following this visit please schedule a follow up visit with your Primary Care doctor or seek care at a local urgent care clinic to avoid delays in care.

## 2020-12-01 ENCOUNTER — Encounter: Payer: Self-pay | Admitting: Family Medicine

## 2020-12-01 ENCOUNTER — Other Ambulatory Visit: Payer: Self-pay | Admitting: Family Medicine

## 2020-12-01 ENCOUNTER — Ambulatory Visit (INDEPENDENT_AMBULATORY_CARE_PROVIDER_SITE_OTHER): Payer: 59 | Admitting: Family Medicine

## 2020-12-01 ENCOUNTER — Other Ambulatory Visit: Payer: Self-pay

## 2020-12-01 VITALS — BP 118/62 | HR 103 | Temp 97.0°F | Wt 160.4 lb

## 2020-12-01 DIAGNOSIS — R12 Heartburn: Secondary | ICD-10-CM

## 2020-12-01 DIAGNOSIS — R0981 Nasal congestion: Secondary | ICD-10-CM

## 2020-12-01 MED ORDER — FAMOTIDINE 20 MG PO TABS: 20.0000 mg | ORAL_TABLET | Freq: Every day | ORAL | 3 refills | Status: DC

## 2020-12-01 MED ORDER — MONTELUKAST SODIUM 10 MG PO TABS: 10.0000 mg | ORAL_TABLET | Freq: Every day | ORAL | 3 refills | Status: DC

## 2020-12-01 MED ORDER — FLUTICASONE PROPIONATE 50 MCG/ACT NA SUSP: 2.0000 | Freq: Every day | NASAL | 1 refills | Status: DC

## 2020-12-01 MED FILL — FLUTICASONE PROP 50 MCG SPR: 50 | 30 days supply | Qty: 16 | Fill #0

## 2020-12-01 MED FILL — MONTELUKAST SOD 10 MG TAB: 10 | 30 days supply | Qty: 30 | Fill #0

## 2020-12-01 MED FILL — FAMOTIDINE 20 MG TABS: 20 | 30 days supply | Qty: 30 | Fill #0

## 2020-12-01 NOTE — Assessment & Plan Note (Signed)
Story most consistent with allergic rhinitis, anticipate perennial. Has not responded to antihistamines or inhaled steroid. Will stop antihistamine, trial singulair given predominant nasal congestion component. Discussed nasal saline irrigation, allergen avoidance measures. If ongoing despite this, will refer to allergist for further evaluation. Pt agrees with plan.

## 2020-12-01 NOTE — Patient Instructions (Addendum)
I'm still suspicious for allergic rhinitis with predominant congestion symptoms. Stop zyrtec. Continue flonase.  Start singulair prescription daily for 1 month, if effective continue, if not the stop. Start pepcid over the counter for acid reflux - take nightly.  If no better, let me know for allergist evaluation  Work on allergen avoidance measures, carry saline nasal spray with you.   Allergic Rhinitis, Adult  Allergic rhinitis is an allergic reaction that affects the mucous membrane inside the nose. The mucous membrane is the tissue that produces mucus. There are two types of allergic rhinitis:  Seasonal. This type is also called hay fever and happens only during certain seasons.  Perennial. This type can happen at any time of the year. Allergic rhinitis cannot be spread from person to person. This condition can be mild, moderate, or severe. It can develop at any age and may be outgrown. What are the causes? This condition is caused by allergens. These are things that can cause an allergic reaction. Allergens may differ for seasonal allergic rhinitis and perennial allergic rhinitis.  Seasonal allergic rhinitis is triggered by pollen. Pollen can come from grasses, trees, and weeds.  Perennial allergic rhinitis may be triggered by: ? Dust mites. ? Proteins in a pet's urine, saliva, or dander. Dander is dead skin cells from a pet. ? Smoke, mold, or car fumes. What increases the risk? You are more likely to develop this condition if you have a family history of allergies or other conditions related to allergies, including:  Allergic conjunctivitis. This is inflammation of parts of the eyes and eyelids.  Asthma. This condition affects the lungs and makes it hard to breathe.  Atopic dermatitis or eczema. This is long term (chronic) inflammation of the skin.  Food allergies. What are the signs or symptoms? Symptoms of this condition include:  Sneezing or coughing.  A stuffy nose  (nasal congestion), itchy nose, or nasal discharge.  Itchy eyes and tearing of the eyes.  A feeling of mucus dripping down the back of your throat (postnasal drip).  Trouble sleeping.  Tiredness or fatigue.  Headache.  Sore throat. How is this diagnosed? This condition may be diagnosed with your symptoms, medical history, and physical exam. Your health care provider may check for related conditions, such as:  Asthma.  Pink eye. This is eye inflammation caused by infection (conjunctivitis).  Ear infection.  Upper respiratory infection. This is an infection in the nose, throat, or upper airways. You may also have tests to find out which allergens trigger your symptoms. These may include skin tests or blood tests. How is this treated? There is no cure for this condition, but treatment can help control symptoms. Treatment may include:  Taking medicines that block allergy symptoms, such as corticosteroids and antihistamines. Medicine may be given as a shot, nasal spray, or pill.  Avoiding any allergens.  Being exposed again and again to tiny amounts of allergens to help you build a defense against allergens (immunotherapy). This is done if other treatments have not helped. It may include: ? Allergy shots. These are injected medicines that have small amounts of allergen in them. ? Sublingual immunotherapy. This involves taking small doses of a medicine with allergen in it under your tongue. If these treatments do not work, your health care provider may prescribe newer, stronger medicines. Follow these instructions at home: Avoiding allergens Find out what you are allergic to and avoid those allergens. These are some things you can do to help avoid allergens:  If you have perennial allergies: ? Replace carpet with wood, tile, or vinyl flooring. Carpet can trap dander and dust. ? Do not smoke. Do not allow smoking in your home. ? Change your heating and air conditioning filters at  least once a month.  If you have seasonal allergies, take these steps during allergy season: ? Keep windows closed as much as possible. ? Plan outdoor activities when pollen counts are lowest. Check pollen counts before you plan outdoor activities. ? When coming indoors, change clothing and shower before sitting on furniture or bedding.  If you have a pet in the house that produces allergens: ? Keep the pet out of the bedroom. ? Vacuum, sweep, and dust regularly. General instructions  Take over-the-counter and prescription medicines only as told by your health care provider.  Drink enough fluid to keep your urine pale yellow.  Keep all follow-up visits as told by your health care provider. This is important. Where to find more information  American Academy of Allergy, Asthma & Immunology: www.aaaai.org Contact a health care provider if:  You have a fever.  You develop a cough that does not go away.  You make whistling sounds when you breathe (wheeze).  Your symptoms slow you down or stop you from doing your normal activities each day. Get help right away if:  You have shortness of breath. This symptom may represent a serious problem that is an emergency. Do not wait to see if the symptom will go away. Get medical help right away. Call your local emergency services (911 in the [place] Do not drive yourself to the hospital. Summary  Allergic rhinitis may be managed by taking medicines as directed and avoiding allergens.  If you have seasonal allergies, keep windows closed as much as possible during allergy season.  Contact your health care provider if you develop a fever or a cough that does not go away. This information is not intended to replace advice given to you by your health care provider. Make sure you discuss any questions you have with your health care provider. Document Revised: 10/17/2019 Document Reviewed: 08/26/2019 Elsevier Patient Education  2021 [employer].

## 2020-12-01 NOTE — Progress Notes (Signed)
Patient ID: Martin Gonzalez, male    DOB: [birth year], 25 y.o.   MRN: [id number]  This visit was conducted in person.  BP 118/62 (BP Location: Right Arm, Patient Position: Sitting, Cuff Size: Normal)   Pulse (!) 103   Temp (!) 97 F (36.1 C) (Temporal)   Wt 160 lb 6 oz (72.7 kg)   SpO2 96%   BMI 23.09 kg/m    CC: 2 wk f/u visit  Subjective:   HPI: Martin Gonzalez is a 25 y.o. male presenting on 12/01/2020 for Follow-up (2 week f/u)   Seen virtually earlier this month - for nasal congestion and sore throat - thought allergy related. Started on zyrtec + flonase without any benefit noted.   Ongoing issues for years - nasal congestion of both nostrils. No recent change in symptoms. + mucous with PNdrainage.   Also notes intermittent acid reflux and heartburn. This can lead to shortness of breath. No NSAIDs, limiting alcohol, non smoker, drinks 1 soda/day.    No rhinorrhea, itchy watery eyes.  No fevers/chills, ST, hoarseness, cough, wheezing, chest pain, dizziness.  No abd pain, nausea/vomiting.  No h/o asthma or eczema.      Relevant past medical, surgical, family and social history reviewed and updated as indicated. Interim medical history since our last visit reviewed. Allergies and medications reviewed and updated. Outpatient Medications Prior to Visit  Medication Sig Dispense Refill  . lamoTRIgine (LAMICTAL) 150 MG tablet Take 150 mg by mouth 2 (two) times daily.    Marland Kitchen loratadine (CLARITIN) 10 MG tablet Take 10 mg by mouth daily as needed for allergies.     . clotrimazole (LOTRIMIN AF) 1 % cream Apply 1 application topically 2 (two) times daily. 30 g 0  . doxycycline (VIBRA-TABS) 100 MG tablet Take 1 tablet (100 mg total) by mouth 2 (two) times daily. 14 tablet 0  . ibuprofen (ADVIL) 200 MG tablet Take 400 mg by mouth every 6 (six) hours as needed.     . Multiple Vitamins-Minerals (EMERGEN-C IMMUNE PO) Take 1 packet by mouth daily as needed (cold symptoms).      No  facility-administered medications prior to visit.     Per HPI unless specifically indicated in ROS section below Review of Systems Objective:  BP 118/62 (BP Location: Right Arm, Patient Position: Sitting, Cuff Size: Normal)   Pulse (!) 103   Temp (!) 97 F (36.1 C) (Temporal)   Wt 160 lb 6 oz (72.7 kg)   SpO2 96%   BMI 23.09 kg/m   Wt Readings from Last 3 Encounters:  12/01/20 160 lb 6 oz (72.7 kg)  06/28/20 149 lb 11.2 oz (67.9 kg)  05/12/20 145 lb 3.2 oz (65.9 kg)      Physical Exam Vitals and nursing note reviewed.  Constitutional:      Appearance: Normal appearance. He is not ill-appearing.  HENT:     Head: Normocephalic and atraumatic.     Right Ear: Tympanic membrane, ear canal and external ear normal.     Left Ear: Tympanic membrane, ear canal and external ear normal.     Nose: Congestion present. No rhinorrhea.     Right Turbinates: Not enlarged, swollen or pale.     Left Turbinates: Enlarged. Not swollen or pale.     Right Sinus: No maxillary sinus tenderness or frontal sinus tenderness.     Left Sinus: No maxillary sinus tenderness or frontal sinus tenderness.     Comments: Crowded nasal passage on left  Mouth/Throat:     Mouth: Mucous membranes are moist.     Pharynx: Oropharynx is clear. No oropharyngeal exudate or posterior oropharyngeal erythema.  Eyes:     Extraocular Movements: Extraocular movements intact.     Pupils: Pupils are equal, round, and reactive to light.  Neck:     Thyroid: No thyroid mass or thyromegaly.  Cardiovascular:     Rate and Rhythm: Normal rate and regular rhythm.     Pulses: Normal pulses.     Heart sounds: Normal heart sounds. No murmur heard.   Pulmonary:     Effort: Pulmonary effort is normal. No respiratory distress.     Breath sounds: Normal breath sounds. No wheezing, rhonchi or rales.  Musculoskeletal:     Cervical back: Normal range of motion and neck supple.  Neurological:     Mental Status: He is alert.   Psychiatric:        Mood and Affect: Mood normal.        Behavior: Behavior normal.       Assessment & Plan:  This visit occurred during the SARS-CoV-2 public health emergency.  Safety protocols were in place, including screening questions prior to the visit, additional usage of staff PPE, and extensive cleaning of exam room while observing appropriate contact time as indicated for disinfecting solutions.   Problem List Items Addressed This Visit    Nasal congestion - Primary    Story most consistent with allergic rhinitis, anticipate perennial. Has not responded to antihistamines or inhaled steroid. Will stop antihistamine, trial singulair given predominant nasal congestion component. Discussed nasal saline irrigation, allergen avoidance measures. If ongoing despite this, will refer to allergist for further evaluation. Pt agrees with plan.       Heartburn    Endorsing intermittent heartburn trouble. Reviewed diet choices to improve symptoms. Trial pepcid at night time. Update with effect.           Meds ordered this encounter  Medications  . montelukast (SINGULAIR) 10 MG tablet    Sig: Take 1 tablet (10 mg total) by mouth at bedtime.    Dispense:  30 tablet    Refill:  3  . fluticasone (FLONASE) 50 MCG/ACT nasal spray    Sig: Place 2 sprays into both nostrils daily.    Dispense:  16 g    Refill:  1  . famotidine (PEPCID) 20 MG tablet    Sig: Take 1 tablet (20 mg total) by mouth at bedtime.    Dispense:  30 tablet    Refill:  3   No orders of the defined types were placed in this encounter.   Patient instructions: I'm still suspicious for allergic rhinitis with predominant congestion symptoms. Stop zyrtec. Continue flonase.  Start singulair prescription daily for 1 month, if effective continue, if not the stop. Start pepcid over the counter for acid reflux - take nightly.  If no better, let me know for allergist evaluation  Work on allergen avoidance measures, carry  saline nasal spray with you.   Follow up plan: Return if symptoms worsen or fail to improve.  [name], MD

## 2020-12-01 NOTE — Assessment & Plan Note (Signed)
Endorsing intermittent heartburn trouble. Reviewed diet choices to improve symptoms. Trial pepcid at night time. Update with effect.

## 2020-12-02 MED FILL — lamoTRIgine 150 MG TABS: 150 | 30 days supply | Qty: 60 | Fill #5

## 2020-12-15 ENCOUNTER — Other Ambulatory Visit: Payer: Self-pay

## 2020-12-15 ENCOUNTER — Emergency Department
Admission: EM | Admit: 2020-12-15 | Discharge: 2020-12-15 | Disposition: A | Discharge status: Home / Self Care | Payer: 59 | Attending: Emergency Medicine | Admitting: Emergency Medicine

## 2020-12-15 ENCOUNTER — Encounter: Payer: Self-pay | Admitting: Emergency Medicine

## 2020-12-15 DIAGNOSIS — R569 Unspecified convulsions: Secondary | ICD-10-CM

## 2020-12-15 DIAGNOSIS — Z79899 Other long term (current) drug therapy: Secondary | ICD-10-CM | POA: Diagnosis not present

## 2020-12-15 DIAGNOSIS — Z87891 Personal history of nicotine dependence: Secondary | ICD-10-CM | POA: Insufficient documentation

## 2020-12-15 DIAGNOSIS — G40909 Epilepsy, unspecified, not intractable, without status epilepticus: Secondary | ICD-10-CM | POA: Insufficient documentation

## 2020-12-15 DIAGNOSIS — R682 Dry mouth, unspecified: Secondary | ICD-10-CM | POA: Insufficient documentation

## 2020-12-15 DIAGNOSIS — R197 Diarrhea, unspecified: Secondary | ICD-10-CM | POA: Diagnosis not present

## 2020-12-15 DIAGNOSIS — F1010 Alcohol abuse, uncomplicated: Secondary | ICD-10-CM | POA: Diagnosis not present

## 2020-12-15 LAB — CBC
HCT: 50.2 % (ref 39.0–52.0)
Hemoglobin: 17.4 g/dL — ABNORMAL HIGH (ref 13.0–17.0)
MCH: 31 pg (ref 26.0–34.0)
MCHC: 34.7 g/dL (ref 30.0–36.0)
MCV: 89.3 fL (ref 80.0–100.0)
Platelets: 309 10*3/uL (ref 150–400)
RBC: 5.62 MIL/uL (ref 4.22–5.81)
RDW: 11.9 % (ref 11.5–15.5)
WBC: 11.8 10*3/uL — ABNORMAL HIGH (ref 4.0–10.5)
nRBC: 0 % (ref 0.0–0.2)

## 2020-12-15 LAB — BASIC METABOLIC PANEL
Anion gap: 20 — ABNORMAL HIGH (ref 5–15)
BUN: 13 mg/dL (ref 6–20)
CO2: 19 mmol/L — ABNORMAL LOW (ref 22–32)
Calcium: 10.2 mg/dL (ref 8.9–10.3)
Chloride: 97 mmol/L — ABNORMAL LOW (ref 98–111)
Creatinine, Ser: 1.38 mg/dL — ABNORMAL HIGH (ref 0.61–1.24)
GFR, Estimated: >60 mL/min (ref >60)
Glucose, Bld: 141 mg/dL — ABNORMAL HIGH (ref 70–99)
Potassium: 3.7 mmol/L (ref 3.5–5.1)
Sodium: 136 mmol/L (ref 135–145)

## 2020-12-15 LAB — HEPATIC FUNCTION PANEL
ALT: 23 U/L (ref 0–44)
AST: 39 U/L (ref 15–41)
Albumin: 5.1 g/dL — ABNORMAL HIGH (ref 3.5–5.0)
Alkaline Phosphatase: 67 U/L (ref 38–126)
Bilirubin, Direct: 0.2 mg/dL (ref 0.0–0.2)
Indirect Bilirubin: 0.8 mg/dL (ref 0.3–0.9)
Total Bilirubin: 1 mg/dL (ref 0.3–1.2)
Total Protein: 8.6 g/dL — ABNORMAL HIGH (ref 6.5–8.1)

## 2020-12-15 LAB — MAGNESIUM: Magnesium: 2.9 mg/dL — ABNORMAL HIGH (ref 1.7–2.4)

## 2020-12-15 LAB — LIPASE, BLOOD: Lipase: 25 U/L (ref 11–51)

## 2020-12-15 LAB — CBG MONITORING, ED: Glucose-Capillary: 139 mg/dL — ABNORMAL HIGH (ref 70–99)

## 2020-12-15 MED ORDER — LACTATED RINGERS IV BOLUS
1000.0000 mL | Freq: Once | INTRAVENOUS | Status: AC
  Administered 2020-12-15: 1000 mL via INTRAVENOUS

## 2020-12-15 MED ORDER — ONDANSETRON HCL 4 MG PO TABS: 4.0000 mg | ORAL_TABLET | Freq: Three times a day (TID) | ORAL | 0 refills | Status: DC | PRN

## 2020-12-15 MED ORDER — ONDANSETRON HCL 4 MG/2ML IJ SOLN
4.0000 mg | Freq: Once | INTRAMUSCULAR | Status: AC
  Administered 2020-12-15: 4 mg via INTRAVENOUS
  Filled 2020-12-15: qty 2

## 2020-12-15 NOTE — ED Notes (Signed)
EDP at bedside.  Pt had seizure a few hours ago. Takes Lamictal as prescribed. Drank about 10-12 beers last night.   Pt in NAD. Mother at bedside.
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
## 2020-12-15 NOTE — ED Triage Notes (Addendum)
Pt via POV from home. Per mom, pt had a seizure approx 30 mins ago. Unknown how long seizure lasted. Per family, pt was vomiting and unresponsive. Pt is still mildly confused, still post-ictal. Pt is takes Lamictal and states that he has taken all his doses. Pt is accompanied by mother. Last seizure was in 2022. No recent new or dosage changes to any of his medication.

## 2020-12-15 NOTE — ED Notes (Signed)
CBG 139. 

## 2021-01-05 ENCOUNTER — Encounter: Payer: Self-pay | Admitting: Family Medicine

## 2021-01-05 ENCOUNTER — Other Ambulatory Visit (HOSPITAL_COMMUNITY): Payer: Self-pay

## 2021-01-05 ENCOUNTER — Ambulatory Visit (INDEPENDENT_AMBULATORY_CARE_PROVIDER_SITE_OTHER): Payer: 59 | Admitting: Family Medicine

## 2021-01-05 ENCOUNTER — Other Ambulatory Visit: Payer: Self-pay

## 2021-01-05 VITALS — BP 118/90 | HR 57 | Temp 97.7°F | Ht 70.0 in | Wt 162.6 lb

## 2021-01-05 DIAGNOSIS — R0981 Nasal congestion: Secondary | ICD-10-CM | POA: Diagnosis not present

## 2021-01-05 DIAGNOSIS — J3089 Other allergic rhinitis: Secondary | ICD-10-CM | POA: Diagnosis not present

## 2021-01-05 DIAGNOSIS — G40909 Epilepsy, unspecified, not intractable, without status epilepticus: Secondary | ICD-10-CM | POA: Diagnosis not present

## 2021-01-05 MED FILL — Lamotrigine Tab 150 MG: ORAL | 30 days supply | Qty: 60 | Fill #0 | Status: AC

## 2021-01-05 NOTE — Patient Instructions (Addendum)
For ongoing nasal congestion we will refer you to allergist.  Continue flonase and antihistamine for now, but hold 1 week prior to your appointment.

## 2021-01-05 NOTE — Progress Notes (Signed)
Patient ID: Martin Gonzalez, male    DOB: 1996-05-08, 25 y.o.   MRN: [id number]  This visit was conducted in person.  BP 118/90   Pulse (!) 57   Temp 97.7 F (36.5 C) (Temporal)   Ht 5\' 10"  (1.778 m)   Wt 162 lb 9 oz (73.7 kg)   SpO2 100%   BMI 23.33 kg/m    CC: 1 mo f/u visit  Subjective:   HPI: Martin Gonzalez is a 25 y.o. male presenting on 01/05/2021 for Follow-up (Here for 1 mo f/u for nasal congestion. )   See prior note for details.  Longstanding bilateral nasal congestion with PNDrainage treated with zyrtec, flonase without any benefit. Last visit we transitioned from antihistamine to singulair nightly - hasn't noted benefit with this. Already avoiding allergens and carries nasal saline with him. No fevers, facial pain, purulent drainage. No h/o asthma.   Also had intermittent acid reflux /heartburn treated with nightly pepcid - overall stable period, has stopped pepcid use.   Seen at ER 12/15/2020 for seizure after drinking alcohol despite regular lamictal use.      Relevant past medical, surgical, family and social history reviewed and updated as indicated. Interim medical history since our last visit reviewed. Allergies and medications reviewed and updated. Outpatient Medications Prior to Visit  Medication Sig Dispense Refill  . famotidine (PEPCID) 20 MG tablet TAKE 1 TABLET (20 MG TOTAL) BY MOUTH AT BEDTIME. 30 tablet 3  . fluticasone (FLONASE) 50 MCG/ACT nasal spray PLACE 2 SPRAYS INTO BOTH NOSTRILS DAILY. 16 g 1  . lamoTRIgine (LAMICTAL) 150 MG tablet TAKE 1 TABLET (150 MG TOTAL) BY MOUTH 2 (TWO) TIMES DAILY 60 tablet 8  . montelukast (SINGULAIR) 10 MG tablet TAKE 1 TABLET (10 MG TOTAL) BY MOUTH AT BEDTIME. (Patient not taking: Reported on 01/05/2021) 30 tablet 3  . cefixime (SUPRAX) 400 MG CAPS capsule TAKE 2 CAPSULES (800 MG TOTAL) BY MOUTH ONCE FOR 1 DOSE. 2 capsule 0  . lamoTRIgine (LAMICTAL) 150 MG tablet Take 150 mg by mouth 2 (two) times daily.    . ondansetron  (ZOFRAN) 4 MG tablet Take 1 tablet (4 mg total) by mouth every 8 (eight) hours as needed for up to 10 doses for nausea or vomiting. 10 tablet 0   No facility-administered medications prior to visit.     Per HPI unless specifically indicated in ROS section below Review of Systems Objective:  BP 118/90   Pulse (!) 57   Temp 97.7 F (36.5 C) (Temporal)   Ht 5\' 10"  (1.778 m)   Wt 162 lb 9 oz (73.7 kg)   SpO2 100%   BMI 23.33 kg/m   Wt Readings from Last 3 Encounters:  01/05/21 162 lb 9 oz (73.7 kg)  12/15/20 160 lb (72.6 kg)  12/01/20 160 lb 6 oz (72.7 kg)      Physical Exam Vitals and nursing note reviewed.  Constitutional:      Appearance: Normal appearance. He is not ill-appearing.  HENT:     Head: Normocephalic and atraumatic.     Nose: Congestion present. No rhinorrhea.     Right Turbinates: Enlarged. Not swollen or pale.     Left Turbinates: Not enlarged, swollen or pale.     Mouth/Throat:     Mouth: Mucous membranes are moist.     Pharynx: Oropharynx is clear. No oropharyngeal exudate or posterior oropharyngeal erythema.  Eyes:     Extraocular Movements: Extraocular movements intact.     Conjunctiva/sclera:  Conjunctivae normal.     Pupils: Pupils are equal, round, and reactive to light.  Cardiovascular:     Rate and Rhythm: Normal rate and regular rhythm.     Pulses: Normal pulses.     Heart sounds: Normal heart sounds. No murmur heard.   Pulmonary:     Effort: Pulmonary effort is normal. No respiratory distress.     Breath sounds: Normal breath sounds. No wheezing, rhonchi or rales.  Musculoskeletal:     Cervical back: Normal range of motion and neck supple.     Right lower leg: No edema.     Left lower leg: No edema.  Lymphadenopathy:     Cervical: No cervical adenopathy.  Skin:    General: Skin is warm and dry.     Findings: No rash.  Neurological:     Mental Status: He is alert.  Psychiatric:        Mood and Affect: Mood normal.        Behavior:  Behavior normal.        Assessment & Plan:  This visit occurred during the SARS-CoV-2 public health emergency.  Safety protocols were in place, including screening questions prior to the visit, additional usage of staff PPE, and extensive cleaning of exam room while observing appropriate contact time as indicated for disinfecting solutions.   Problem List Items Addressed This Visit    Perennial allergic rhinitis - Primary    Chronic nasal congestion not responding to antihistamine, INS, singulair. Pt aware of allergen avoidance measures as well as regular use of nasal saline irrigation. Will refer to allergist for further eval/management. Pt agrees with plan.  If unrevealing allergy workup, consider possible side effect to lamictal.       Relevant Orders   Ambulatory referral to Allergy   Seizure disorder (HCC)    Continues lamictal 150mg  bid. Recent seizure related to alcohol use. Reviewed need to avoid alcohol in the future given it decreases seizure threshold.           No orders of the defined types were placed in this encounter.  Orders Placed This Encounter  Procedures  . Ambulatory referral to Allergy    Referral Priority:   Routine    Referral Type:   Allergy Testing    Referral Reason:   Specialty Services Required    Requested Specialty:   Allergy    Number of Visits Requested:   1    Patient Instructions  For ongoing nasal congestion we will refer you to allergist.  Continue flonase and antihistamine for now, but hold 1 week prior to your appointment.   Follow up plan: Return if symptoms worsen or fail to improve.  , MD

## 2021-01-05 NOTE — Assessment & Plan Note (Signed)
Continues lamictal 150mg  bid. Recent seizure related to alcohol use. Reviewed need to avoid alcohol in the future given it decreases seizure threshold.

## 2021-01-05 NOTE — Assessment & Plan Note (Addendum)
Chronic nasal congestion not responding to antihistamine, INS, singulair. Pt aware of allergen avoidance measures as well as regular use of nasal saline irrigation. Will refer to allergist for further eval/management. Pt agrees with plan.  If unrevealing allergy workup, consider possible side effect to lamictal.

## 2021-01-11 ENCOUNTER — Other Ambulatory Visit (HOSPITAL_COMMUNITY): Payer: Self-pay

## 2021-01-11 MED ORDER — LAMOTRIGINE 150 MG PO TABS
150.0000 mg | ORAL_TABLET | Freq: Every morning | ORAL | 8 refills | Status: DC
  Filled 2021-01-11 – 2021-03-08 (×2): qty 30, 30d supply, fill #0

## 2021-01-11 MED ORDER — LAMOTRIGINE 200 MG PO TABS
200.0000 mg | ORAL_TABLET | Freq: Every evening | ORAL | 8 refills | Status: DC
  Filled 2021-01-11: qty 30, 30d supply, fill #0
  Filled 2021-02-15: qty 30, 30d supply, fill #1
  Filled 2021-03-04 – 2021-03-08 (×2): qty 30, 30d supply, fill #2

## 2021-02-03 ENCOUNTER — Other Ambulatory Visit (HOSPITAL_COMMUNITY): Payer: Self-pay

## 2021-02-03 MED ORDER — FLUTICASONE PROPIONATE 50 MCG/ACT NA SUSP
2.0000 | Freq: Every day | NASAL | 12 refills | Status: DC
  Filled 2021-02-03: qty 16, 30d supply, fill #0
  Filled 2021-03-25: qty 16, 30d supply, fill #1
  Filled 2021-05-04: qty 16, 30d supply, fill #2
  Filled 2021-06-03: qty 16, 30d supply, fill #3
  Filled 2021-07-18 – 2021-10-03 (×2): qty 16, 30d supply, fill #4
  Filled 2021-12-09 (×2): qty 16, 30d supply, fill #5

## 2021-02-03 MED ORDER — AZELASTINE HCL 0.1 % NA SOLN
1.0000 | Freq: Two times a day (BID) | NASAL | 12 refills | Status: DC
  Filled 2021-02-03: qty 30, 25d supply, fill #0
  Filled 2021-03-25: qty 30, 25d supply, fill #1
  Filled 2021-05-04: qty 30, 25d supply, fill #2
  Filled 2021-06-03: qty 30, 25d supply, fill #3
  Filled 2021-07-18: qty 30, 25d supply, fill #4
  Filled 2021-10-03: qty 30, 25d supply, fill #5
  Filled 2021-12-09 (×2): qty 30, 25d supply, fill #6

## 2021-02-15 ENCOUNTER — Other Ambulatory Visit (HOSPITAL_COMMUNITY): Payer: Self-pay

## 2021-02-15 MED FILL — Montelukast Sodium Tab 10 MG (Base Equiv): ORAL | 30 days supply | Qty: 30 | Fill #0 | Status: AC

## 2021-02-15 MED FILL — Famotidine Tab 20 MG: ORAL | 30 days supply | Qty: 30 | Fill #0 | Status: AC

## 2021-03-04 ENCOUNTER — Other Ambulatory Visit (HOSPITAL_COMMUNITY): Payer: Self-pay

## 2021-03-08 ENCOUNTER — Other Ambulatory Visit: Payer: Self-pay

## 2021-03-09 ENCOUNTER — Other Ambulatory Visit: Payer: Self-pay

## 2021-03-10 ENCOUNTER — Other Ambulatory Visit (HOSPITAL_COMMUNITY): Payer: Self-pay

## 2021-03-10 MED ORDER — LAMOTRIGINE 150 MG PO TABS
150.0000 mg | ORAL_TABLET | Freq: Every morning | ORAL | 3 refills | Status: DC
  Filled 2021-03-10 – 2021-03-25 (×2): qty 30, 30d supply, fill #0

## 2021-03-25 ENCOUNTER — Other Ambulatory Visit: Payer: Self-pay

## 2021-03-25 MED FILL — Montelukast Sodium Tab 10 MG (Base Equiv): ORAL | 30 days supply | Qty: 30 | Fill #1 | Status: AC

## 2021-03-25 MED FILL — Famotidine Tab 20 MG: ORAL | 30 days supply | Qty: 30 | Fill #1 | Status: AC

## 2021-03-25 MED FILL — Lamotrigine Tab 150 MG: ORAL | 30 days supply | Qty: 60 | Fill #1 | Status: AC

## 2021-03-28 ENCOUNTER — Other Ambulatory Visit: Payer: Self-pay

## 2021-05-04 ENCOUNTER — Other Ambulatory Visit: Payer: Self-pay

## 2021-05-04 MED FILL — Famotidine Tab 20 MG: ORAL | 30 days supply | Qty: 30 | Fill #2 | Status: AC

## 2021-05-04 MED FILL — Lamotrigine Tab 150 MG: ORAL | 30 days supply | Qty: 60 | Fill #2 | Status: AC

## 2021-05-04 MED FILL — Montelukast Sodium Tab 10 MG (Base Equiv): ORAL | 30 days supply | Qty: 30 | Fill #2 | Status: AC

## 2021-06-03 ENCOUNTER — Other Ambulatory Visit: Payer: Self-pay | Admitting: Family Medicine

## 2021-06-03 ENCOUNTER — Other Ambulatory Visit: Payer: Self-pay

## 2021-06-03 MED ORDER — LAMOTRIGINE 150 MG PO TABS
ORAL_TABLET | Freq: Two times a day (BID) | ORAL | 8 refills | Status: DC
  Filled 2021-06-03: qty 60, 30d supply, fill #0
  Filled 2021-07-11: qty 60, 30d supply, fill #1
  Filled 2021-09-07: qty 60, 30d supply, fill #2
  Filled 2021-10-11: qty 60, 30d supply, fill #3
  Filled 2021-11-10: qty 60, 30d supply, fill #4
  Filled 2021-12-09: qty 180, 90d supply, fill #5
  Filled 2021-12-09 (×2): qty 60, 30d supply, fill #5
  Filled 2022-01-09: qty 60, 30d supply, fill #6
  Filled 2022-02-09: qty 60, 30d supply, fill #7
  Filled 2022-03-13: qty 60, 30d supply, fill #8

## 2021-06-04 MED ORDER — FAMOTIDINE 20 MG PO TABS
ORAL_TABLET | Freq: Every day | ORAL | 3 refills | Status: DC
  Filled 2021-06-04: qty 30, 30d supply, fill #0

## 2021-06-04 MED FILL — Montelukast Sodium Tab 10 MG (Base Equiv): ORAL | 30 days supply | Qty: 30 | Fill #0 | Status: AC

## 2021-06-06 ENCOUNTER — Other Ambulatory Visit: Payer: Self-pay

## 2021-07-11 ENCOUNTER — Other Ambulatory Visit: Payer: Self-pay

## 2021-07-18 ENCOUNTER — Other Ambulatory Visit: Payer: Self-pay

## 2021-08-02 ENCOUNTER — Other Ambulatory Visit: Payer: Self-pay

## 2021-08-02 MED ORDER — LAMOTRIGINE 150 MG PO TABS
150.0000 mg | ORAL_TABLET | Freq: Two times a day (BID) | ORAL | 8 refills | Status: DC
  Filled 2021-08-02: qty 60, 30d supply, fill #0

## 2021-08-17 ENCOUNTER — Encounter: Payer: Self-pay | Admitting: Otolaryngology

## 2021-08-22 NOTE — Discharge Instructions (Signed)
[facility] ENDOSCOPIC SINUS SURGERY [facility]  What is Functional Endoscopic Sinus Surgery?  The Surgery involves making the natural openings of the sinuses larger by removing the bony partitions that separate the sinuses from the nasal cavity.  The natural sinus lining is preserved as much as possible to allow the sinuses to resume normal function after the surgery.  In some patients nasal polyps (excessively swollen lining of the sinuses) may be removed to relieve obstruction of the sinus openings.  The surgery is performed through the nose using lighted scopes, which eliminates the need for incisions on the face.  A septoplasty is a different procedure which is sometimes performed with sinus surgery.  It involves straightening the boy partition that separates the two sides of your nose.  A crooked or deviated septum may need repair if is obstructing the sinuses or nasal airflow.  Turbinate reduction is also often performed during sinus surgery.  The turbinates are bony proturberances from the side walls of the nose which swell and can obstruct the nose in patients with sinus and allergy problems.  Their size can be surgically reduced to help relieve nasal obstruction.  What Can Sinus Surgery Do For Me?  Sinus surgery can reduce the frequency of sinus infections requiring antibiotic treatment.  This can provide improvement in nasal congestion, post-nasal drainage, facial pressure and nasal obstruction.  Surgery will NOT prevent you from ever having an infection again, so it usually only for patients who get infections 4 or more times yearly requiring antibiotics, or for infections that do not clear with antibiotics.  It will not cure nasal allergies, so patients with allergies may still require medication to treat their allergies after surgery. Surgery may improve headaches related to sinusitis, however, some people will continue to  require medication to control sinus headaches related to allergies.  Surgery will do nothing for other forms of headache (migraine, tension or cluster).  What Are the Risks of Endoscopic Sinus Surgery?  Current techniques allow surgery to be performed safely with little risk, however, there are rare complications that patients should be aware of.  Because the sinuses are located around the eyes, there is risk of eye injury, including blindness, though again, this would be quite rare. This is usually a result of bleeding behind the eye during surgery, which can effect vision, though there are treatments to protect the vision and prevent permanent injury. More serious complications would include bleeding inside the brain cavity or damage to the brain.This happens when the fluid around the brain leaks out into the sinus cavity.  Again, all of these complications are uncommon, and spinal fluid leaks can be safely managed surgically if they occur.  The most common complication of sinus surgery is bleeding from the nose, which may require packing or cauterization of the nose.  Patients with polyps may experience recurrence of the polyps that would require revision surgery.  Alterations of sense of smell or injury to the tear ducts are also rare complications.   What is the Surgery Like, and what is the Recovery?  The Surgery usually takes a couple of hours to perform, and is usually performed under a general anesthetic (completely asleep).  Patients are usually discharged home after a couple of hours.  Sometimes during surgery it is necessary to pack the nose to control bleeding, and the packing is left in place for 24 - 48 hours, and removed by your surgeon.  If   a septoplasty was performed during the procedure, there is often a splint placed which must be removed after 5-7 days.   Discomfort: Pain is usually mild to moderate, and can be controlled by prescription pain medication or acetaminophen (Tylenol).   Aspirin, Ibuprofen (Advil, Motrin), or Naprosyn (Aleve) should be avoided, as they can cause increased bleeding.  Most patients feel sinus pressure like they have a bad head cold for several days.  Sleeping with your head elevated can help reduce swelling and facial pressure, as can ice packs over the face.  A humidifier may be helpful to keep the mucous and blood from drying in the nose.   Diet: There are no specific diet restrictions, however, you should generally start with clear liquids and a light diet of bland foods because the anesthetic can cause some nausea.  Advance your diet depending on how your stomach feels.  Taking your pain medication with food will often help reduce stomach upset which pain medications can cause.  Nasal Saline Irrigation: It is important to remove blood clots and dried mucous from the nose as it is healing.  This is done by having you irrigate the nose at least 3 - 4 times daily with a salt water solution.  We recommend using NeilMed Sinus Rinse (available at the drug store).  Fill the squeeze bottle with the solution, bend over a sink, and insert the tip of the squeeze bottle into the nose  of an inch.  Point the tip of the squeeze bottle towards the inside corner of the eye on the same side your irrigating.  Squeeze the bottle and gently irrigate the nose.  If you bend forward as you do this, most of the fluid will flow back out of the nose, instead of down your throat.   The solution should be warm, near body temperature, when you irrigate.   Each time you irrigate, you should use a full squeeze bottle.   Note that if you are instructed to use Nasal Steroid Sprays at any time after your surgery, irrigate with saline BEFORE using the steroid spray, so you do not wash it all out of the nose. Another product, Nasal Saline Gel (such as AYR Nasal Saline Gel) can be applied in each nostril 3 - 4 times daily to moisture the nose and reduce scabbing or crusting.  Bleeding:   Bloody drainage from the nose can be expected for several days, and patients are instructed to irrigate their nose frequently with salt water to help remove mucous and blood clots.  The drainage may be dark red or brown, though some fresh blood may be seen intermittently, especially after irrigation.  Do not blow you nose, as bleeding may occur. If you must sneeze, keep your mouth open to allow air to escape through your mouth.  If heavy bleeding occurs: Irrigate the nose with saline to rinse out clots, then spray the nose 3 - 4 times with Afrin Nasal Decongestant Spray.  The spray will constrict the blood vessels to slow bleeding.  Pinch the lower half of your nose shut to apply pressure, and lay down with your head elevated.  Ice packs over the nose may help as well. If bleeding persists despite these measures, you should notify your doctor.  Do not use the Afrin routinely to control nasal congestion after surgery, as it can result in worsening congestion and may affect healing.     Activity: Return to work varies among patients. Most patients will be out   of work at least 5 - 7 days to recover.  Patient may return to work after they are off of narcotic pain medication, and feeling well enough to perform the functions of their job.  Patients must avoid heavy lifting (over 10 pounds) or strenuous physical for 2 weeks after surgery, so your employer may need to assign you to light duty, or keep you out of work longer if light duty is not possible.  NOTE: you should not drive, operate dangerous machinery, do any mentally demanding tasks or make any important legal or financial decisions while on narcotic pain medication and recovering from the general anesthetic.    Call Your Doctor Immediately if You Have Any of the Following: Bleeding that you cannot control with the above measures Loss of vision, double vision, bulging of the eye or black eyes. Fever over 101 degrees Neck stiffness with severe headache,  fever, nausea and change in mental state. You are always encouraged to call anytime with concerns, however, please call with requests for pain medication refills during office hours.  Office Endoscopy: During follow-up visits your doctor will remove any packing or splints that may have been placed and evaluate and clean your sinuses endoscopically.  Topical anesthetic will be used to make this as comfortable as possible, though you may want to take your pain medication prior to the visit.  How often this will need to be done varies from patient to patient.  After complete recovery from the surgery, you may need follow-up endoscopy from time to time, particularly if there is concern of recurrent infection or nasal polyps.  

## 2021-08-23 ENCOUNTER — Ambulatory Visit: Payer: 59 | Admitting: Anesthesiology

## 2021-08-23 ENCOUNTER — Other Ambulatory Visit: Payer: Self-pay

## 2021-08-23 ENCOUNTER — Encounter
Admission: RE | Disposition: A | Discharge status: Home / Self Care | Payer: Self-pay | Source: Home / Self Care | Attending: Otolaryngology

## 2021-08-23 ENCOUNTER — Ambulatory Visit
Admission: RE | Admit: 2021-08-23 | Discharge: 2021-08-23 | Disposition: A | Discharge status: Home / Self Care | Payer: 59 | Attending: Otolaryngology | Admitting: Otolaryngology

## 2021-08-23 ENCOUNTER — Encounter: Payer: Self-pay | Admitting: Otolaryngology

## 2021-08-23 DIAGNOSIS — J3489 Other specified disorders of nose and nasal sinuses: Secondary | ICD-10-CM | POA: Diagnosis not present

## 2021-08-23 DIAGNOSIS — I10 Essential (primary) hypertension: Secondary | ICD-10-CM | POA: Insufficient documentation

## 2021-08-23 DIAGNOSIS — J343 Hypertrophy of nasal turbinates: Secondary | ICD-10-CM | POA: Insufficient documentation

## 2021-08-23 DIAGNOSIS — Z79899 Other long term (current) drug therapy: Secondary | ICD-10-CM | POA: Insufficient documentation

## 2021-08-23 DIAGNOSIS — J342 Deviated nasal septum: Secondary | ICD-10-CM | POA: Insufficient documentation

## 2021-08-23 HISTORY — PX: NASAL SEPTOPLASTY W/ TURBINOPLASTY: SHX2070

## 2021-08-23 SURGERY — SEPTOPLASTY, NOSE, WITH NASAL TURBINATE REDUCTION
Anesthesia: General | Site: Nose | Laterality: Bilateral

## 2021-08-23 MED ORDER — DEXMEDETOMIDINE (PRECEDEX) IN NS 20 MCG/5ML (4 MCG/ML) IV SYRINGE
PREFILLED_SYRINGE | INTRAVENOUS | Status: DC | PRN
  Administered 2021-08-23: 20 ug via INTRAVENOUS

## 2021-08-23 MED ORDER — HYDROCODONE-ACETAMINOPHEN 5-325 MG PO TABS: 1.0000 | ORAL_TABLET | Freq: Four times a day (QID) | ORAL | 0 refills | Status: DC | PRN

## 2021-08-23 MED ORDER — DEXAMETHASONE SODIUM PHOSPHATE 4 MG/ML IJ SOLN
INTRAMUSCULAR | Status: DC | PRN
  Administered 2021-08-23: 10 mg via INTRAVENOUS

## 2021-08-23 MED ORDER — STERILE WATER FOR IRRIGATION IR SOLN
Status: DC | PRN
  Administered 2021-08-23: 250 mL

## 2021-08-23 MED ORDER — LACTATED RINGERS IV SOLN: INTRAVENOUS | Status: DC

## 2021-08-23 MED ORDER — OXYMETAZOLINE HCL 0.05 % NA SOLN
NASAL | Status: DC | PRN
  Administered 2021-08-23: 1 via TOPICAL

## 2021-08-23 MED ORDER — SUCCINYLCHOLINE CHLORIDE 200 MG/10ML IV SOSY
PREFILLED_SYRINGE | INTRAVENOUS | Status: DC | PRN
Start: 2021-08-23 — End: 2021-08-23
  Administered 2021-08-23: 100 mg via INTRAVENOUS

## 2021-08-23 MED ORDER — CEPHALEXIN 500 MG PO CAPS
500.0000 mg | ORAL_CAPSULE | Freq: Two times a day (BID) | ORAL | 0 refills | Status: DC
  Filled 2021-08-23: qty 20, 10d supply, fill #0

## 2021-08-23 MED ORDER — GLYCOPYRROLATE 0.2 MG/ML IJ SOLN
INTRAMUSCULAR | Status: DC | PRN
  Administered 2021-08-23: .1 mg via INTRAVENOUS

## 2021-08-23 MED ORDER — OXYCODONE HCL 5 MG PO TABS: 5.0000 mg | ORAL_TABLET | Freq: Once | ORAL | Status: AC | PRN

## 2021-08-23 MED ORDER — LIDOCAINE-EPINEPHRINE 1 %-1:100000 IJ SOLN
INTRAMUSCULAR | Status: DC | PRN
  Administered 2021-08-23: 10 mL

## 2021-08-23 MED ORDER — SCOPOLAMINE 1 MG/3DAYS TD PT72
1.0000 | MEDICATED_PATCH | TRANSDERMAL | Status: DC
  Administered 2021-08-23: 1.5 mg via TRANSDERMAL

## 2021-08-23 MED ORDER — MEPERIDINE HCL 25 MG/ML IJ SOLN: 6.2500 mg | INTRAMUSCULAR | Status: DC | PRN

## 2021-08-23 MED ORDER — ONDANSETRON HCL 4 MG/2ML IJ SOLN: 4.0000 mg | Freq: Once | INTRAMUSCULAR | Status: DC | PRN

## 2021-08-23 MED ORDER — ACETAMINOPHEN 10 MG/ML IV SOLN
1000.0000 mg | Freq: Once | INTRAVENOUS | Status: AC
  Administered 2021-08-23: 1000 mg via INTRAVENOUS

## 2021-08-23 MED ORDER — OXYCODONE HCL 5 MG/5ML PO SOLN
5.0000 mg | Freq: Once | ORAL | Status: AC | PRN
  Administered 2021-08-23: 5 mg via ORAL

## 2021-08-23 MED ORDER — HYDROCODONE-ACETAMINOPHEN 5-325 MG PO TABS
ORAL_TABLET | ORAL | 0 refills | Status: DC
  Filled 2021-08-23: qty 40, 7d supply, fill #0

## 2021-08-23 MED ORDER — MIDAZOLAM HCL 5 MG/5ML IJ SOLN
INTRAMUSCULAR | Status: DC | PRN
  Administered 2021-08-23: 2 mg via INTRAVENOUS

## 2021-08-23 MED ORDER — ONDANSETRON HCL 4 MG/2ML IJ SOLN
INTRAMUSCULAR | Status: DC | PRN
  Administered 2021-08-23: 4 mg via INTRAVENOUS

## 2021-08-23 MED ORDER — FENTANYL CITRATE (PF) 100 MCG/2ML IJ SOLN
INTRAMUSCULAR | Status: DC | PRN
  Administered 2021-08-23 (×2): 50 ug via INTRAVENOUS

## 2021-08-23 MED ORDER — PROPOFOL 10 MG/ML IV BOLUS
INTRAVENOUS | Status: DC | PRN
  Administered 2021-08-23: 160 mg via INTRAVENOUS

## 2021-08-23 MED ORDER — LIDOCAINE HCL (CARDIAC) PF 100 MG/5ML IV SOSY
PREFILLED_SYRINGE | INTRAVENOUS | Status: DC | PRN
  Administered 2021-08-23: 50 mg via INTRAVENOUS

## 2021-08-23 MED ORDER — HYDROMORPHONE HCL 1 MG/ML IJ SOLN: 0.2500 mg | INTRAMUSCULAR | Status: DC | PRN

## 2021-08-23 MED ORDER — ACETAMINOPHEN 10 MG/ML IV SOLN: 1000.0000 mg | Freq: Once | INTRAVENOUS | Status: DC | PRN

## 2021-08-23 SURGICAL SUPPLY — 25 items
BLADE SURG 15 STRL LF DISP TIS (BLADE) IMPLANT
BLADE SURG 15 STRL SS (BLADE) ×3
CANISTER SUCT 1200ML W/VALVE (MISCELLANEOUS) ×3 IMPLANT
COAG SUCT 10F 3.5MM HAND CTRL (MISCELLANEOUS) ×3 IMPLANT
CUP MEDICINE 2OZ PLAST GRAD ST (MISCELLANEOUS) ×6 IMPLANT
DRAPE HEAD BAR (DRAPES) ×2 IMPLANT
ELECT REM PT RETURN 9FT ADLT (ELECTROSURGICAL) ×3
ELECTRODE REM PT RTRN 9FT ADLT (ELECTROSURGICAL) ×1 IMPLANT
GLOVE SURG ENC MOIS LTX SZ7.5 (GLOVE) ×6 IMPLANT
GOWN STRL REUS W/ TWL LRG LVL3 (GOWN DISPOSABLE) ×1 IMPLANT
GOWN STRL REUS W/TWL LRG LVL3 (GOWN DISPOSABLE) ×3
KIT TURNOVER KIT A (KITS) ×3 IMPLANT
NDL HYPO 25GX1X1/2 BEV (NEEDLE) ×1 IMPLANT
NEEDLE HYPO 25GX1X1/2 BEV (NEEDLE) ×3 IMPLANT
PACK ENT CUSTOM (PACKS) ×3 IMPLANT
PATTIES SURGICAL .5 X3 (DISPOSABLE) ×3 IMPLANT
SPLINT NASAL SEPTAL PRE-CUT (MISCELLANEOUS) ×3 IMPLANT
SUT CHROMIC 4 0 RB 1X27 (SUTURE) ×2 IMPLANT
SUT ETHILON 4-0 (SUTURE) ×3
SUT ETHILON 4-0 FS2 18XMFL BLK (SUTURE) ×1
SUT PLAIN GUT 4-0 (SUTURE) ×2 IMPLANT
SUTURE ETHLN 4-0 FS2 18XMF BLK (SUTURE) IMPLANT
SYR 10ML LL (SYRINGE) ×3 IMPLANT
TOWEL OR 17X26 4PK STRL BLUE (TOWEL DISPOSABLE) ×3 IMPLANT
WATER STERILE IRR 250ML POUR (IV SOLUTION) ×3 IMPLANT

## 2021-08-23 NOTE — Anesthesia Preprocedure Evaluation (Signed)
Anesthesia Evaluation  Patient identified by MRN, date of birth, ID band Patient awake    Reviewed: Allergy & Precautions, NPO status , Patient's Chart, lab work & pertinent test results, reviewed documented beta blocker date and time   History of Anesthesia Complications Negative for: history of anesthetic complications  Airway Mallampati: III  TM Distance: >3 FB Neck ROM: Full    Dental   Pulmonary Current SmokerPatient did not abstain from smoking., former smoker,    breath sounds clear to auscultation       Cardiovascular hypertension, Pt. on medications and Pt. on home beta blockers (-) angina(-) DOE + dysrhythmias  Rhythm:Regular Rate:Normal     Neuro/Psych  Headaches, Seizures - (Last seizure 12/2020, lamictal taken this AM), Well Controlled,  Anxiety    GI/Hepatic GERD  Controlled,  Endo/Other  Hypothyroidism   Renal/GU      Musculoskeletal   Abdominal   Peds  Hematology   Anesthesia Other Findings   Reproductive/Obstetrics                             Anesthesia Physical Anesthesia Plan  ASA: 2  Anesthesia Plan: General   Post-op Pain Management:    Induction: Intravenous  PONV Risk Score and Plan: 3 and Treatment may vary due to age or medical condition, Ondansetron, Dexamethasone, Midazolam and Scopolamine patch - Pre-op  Airway Management Planned: Oral ETT  Additional Equipment:   Intra-op Plan:   Post-operative Plan: Extubation in OR  Informed Consent: I have reviewed the patients History and Physical, chart, labs and discussed the procedure including the risks, benefits and alternatives for the proposed anesthesia with the patient or authorized representative who has indicated his/her understanding and acceptance.       Plan Discussed with: CRNA and Anesthesiologist  Anesthesia Plan Comments:         Anesthesia Quick Evaluation

## 2021-08-23 NOTE — H&P (Signed)
History and physical reviewed and will be scanned in later. No change in medical status reported by the patient or family, appears stable for surgery. All questions regarding the procedure answered, and patient (or family if a child) expressed understanding of the procedure. ? ?Martin Gonzalez Martin Gonzalez ?@[email or web address]@ ?

## 2021-08-23 NOTE — Transfer of Care (Signed)
Immediate Anesthesia Transfer of Care Note  Patient: Martin Gonzalez  Procedure(s) Performed: NASAL SEPTOPLASTY WITH INFERIOR TURBINATE REDUCTION (Bilateral)  Patient Location: PACU  Anesthesia Type: General  Level of Consciousness: awake, alert  and patient cooperative  Airway and Oxygen Therapy: Patient Spontanous Breathing and Patient connected to supplemental oxygen  Post-op Assessment: Post-op Vital signs reviewed, Patient's Cardiovascular Status Stable, Respiratory Function Stable, Patent Airway and No signs of Nausea or vomiting  Post-op Vital Signs: Reviewed and stable  Complications: No notable events documented.

## 2021-08-23 NOTE — Anesthesia Procedure Notes (Addendum)
Procedure Name: Intubation Date/Time: 08/23/2021 7:49 AM Performed by: Jimmy Picket, CRNA Pre-anesthesia Checklist: Patient identified, Emergency Drugs available, Suction available, Patient being monitored and Timeout performed Patient Re-evaluated:Patient Re-evaluated prior to induction Oxygen Delivery Method: Circle system utilized Preoxygenation: Pre-oxygenation with 100% oxygen Induction Type: IV induction Ventilation: Mask ventilation without difficulty Laryngoscope Size: Miller and 3 Grade View: Grade I Tube type: Oral Rae Tube size: 7.5 mm Number of attempts: 1 Airway Equipment and Method: Bougie stylet Placement Confirmation: ETT inserted through vocal cords under direct vision, positive ETCO2 and breath sounds checked- equal and bilateral Tube secured with: Tape Dental Injury: Teeth and Oropharynx as per pre-operative assessment  Comments: Unable to pass ETT through cords. Bougie inserted without difficulty. ETT passed over bougie. +/= BBS. +EtCO2.

## 2021-08-23 NOTE — Op Note (Signed)
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 Hypertrophy of the inferior turbinates  Procedure: The patient was taken to the Operating Room and placed in the supine position.  After induction of general endotracheal anesthesia, the table was turned 90 degrees. A time-out was issued to confirm the site and procedure. The nasal septum and inferior turbinates where then injected with 1% lidocaine with epiniephrine, 1:100,000. The nose was decongested with Afrin soaked pledgets. The patient was then prepped and draped in the usual sterile fashion. Beginning on the left hand side a hemitransfixion incision was then created on the leading edge of the septum on the left.  A subperichondrial plane was elevated posteriorly on the left and taken back to the perpendicular plate of the ethmoid where subperiosteal plane was elevated posteriorly on the left. A large septal spur was identified on each side.  An inferior rim of redundant septal cartilage was removed from where it deviated over the maxillary crest to the left. The perpendicular plate of the ethmoid was separated from the quadrangular cartilage, and a subperiosteal plane elevated on the right of the bony septum. The large septal spurs were removed, dividing the septal bone superiorly with Knight scissors, and inferiorly from the maxillary crest with a chisel.    The septum was then replaced in the midline. Reinspection through each nostril showed excellent reduction of the septal deformity. A left posterior inferior fenestration was then created to allow hematoma drainage.   The septal incision was closed with 4-0 chromic gut suture. A 4-0 plain gut suture was used to reapproximate the septal flaps to the underlying cartilage, utilizing a running, quilting type stitch.   With the septoplasty completed, beginning on the left-hand side, a 15 blade was used to incise along the inferior edge of the inferior turbinate. A superior laterally based flap of the medial turbinate mucosa was then elevated. A portion of the underlying conchal bone and lateral mucosa was excised using Knight scissors. The flap was then laid back over the turbinate stump and the bleeding edge of the turbinate cauterized using suction cautery. In a similar fashion the submucous resection was performed on the right.  With the submucous resection completed bilaterally and no active bleeding, nasal septal splints were placed within each nostril and affixed to the septum using a 3-0 nylon suture.     The patient was then returned to the anesthesiologist for awakening, and was taken to the Recovery Room in stable condition.  Disposition:   PACU to home  Plan: Take pain medications and antibiotics as prescribed. Begin saline irrigations tomorrow. No strenuous activity for 2 weeks. Follow-up in 1 week.  Sandi Mealy 08/23/2021 8:42 AM

## 2021-08-23 NOTE — Anesthesia Postprocedure Evaluation (Signed)
Anesthesia Post Note  Patient: Martin Gonzalez  Procedure(s) Performed: NASAL SEPTOPLASTY WITH INFERIOR TURBINATE REDUCTION (Bilateral: Nose)     Patient location during evaluation: PACU Anesthesia Type: General Level of consciousness: awake and alert Pain management: pain level controlled Vital Signs Assessment: post-procedure vital signs reviewed and stable Respiratory status: spontaneous breathing, nonlabored ventilation, respiratory function stable and patient connected to nasal cannula oxygen Cardiovascular status: blood pressure returned to baseline and stable Postop Assessment: no apparent nausea or vomiting Anesthetic complications: no   No notable events documented.  Irini Leet A  Janes Colegrove

## 2021-08-24 ENCOUNTER — Encounter: Payer: Self-pay | Admitting: Otolaryngology

## 2021-09-07 ENCOUNTER — Other Ambulatory Visit: Payer: Self-pay

## 2021-09-26 ENCOUNTER — Other Ambulatory Visit (HOSPITAL_COMMUNITY): Payer: Self-pay

## 2021-09-26 MED ORDER — PREDNISONE 10 MG PO TABS
ORAL_TABLET | ORAL | 0 refills | Status: DC
  Filled 2021-09-26 – 2021-09-27 (×2): qty 21, 6d supply, fill #0

## 2021-09-27 ENCOUNTER — Other Ambulatory Visit: Payer: Self-pay

## 2021-09-27 ENCOUNTER — Other Ambulatory Visit (HOSPITAL_COMMUNITY): Payer: Self-pay

## 2021-10-03 ENCOUNTER — Other Ambulatory Visit: Payer: Self-pay

## 2021-10-11 ENCOUNTER — Other Ambulatory Visit: Payer: Self-pay

## 2021-10-11 MED FILL — Montelukast Sodium Tab 10 MG (Base Equiv): ORAL | 30 days supply | Qty: 30 | Fill #1 | Status: AC

## 2021-11-04 ENCOUNTER — Emergency Department
Admission: EM | Admit: 2021-11-04 | Discharge: 2021-11-04 | Disposition: A | Discharge status: Home / Self Care | Payer: Commercial Managed Care - HMO | Attending: Emergency Medicine | Admitting: Emergency Medicine

## 2021-11-04 ENCOUNTER — Other Ambulatory Visit: Payer: Self-pay

## 2021-11-04 DIAGNOSIS — G40909 Epilepsy, unspecified, not intractable, without status epilepticus: Secondary | ICD-10-CM | POA: Insufficient documentation

## 2021-11-04 DIAGNOSIS — R569 Unspecified convulsions: Secondary | ICD-10-CM | POA: Diagnosis present

## 2021-11-04 LAB — COMPREHENSIVE METABOLIC PANEL
ALT: 16 U/L (ref 0–44)
AST: 27 U/L (ref 15–41)
Albumin: 4.9 g/dL (ref 3.5–5.0)
Alkaline Phosphatase: 59 U/L (ref 38–126)
Anion gap: 16 — ABNORMAL HIGH (ref 5–15)
BUN: 10 mg/dL (ref 6–20)
CO2: 23 mmol/L (ref 22–32)
Calcium: 9.8 mg/dL (ref 8.9–10.3)
Chloride: 98 mmol/L (ref 98–111)
Creatinine, Ser: 1.32 mg/dL — ABNORMAL HIGH (ref 0.61–1.24)
GFR, Estimated: >60 mL/min (ref >60)
Glucose, Bld: 150 mg/dL — ABNORMAL HIGH (ref 70–99)
Potassium: 3.9 mmol/L (ref 3.5–5.1)
Sodium: 137 mmol/L (ref 135–145)
Total Bilirubin: 1.1 mg/dL (ref 0.3–1.2)
Total Protein: 7.9 g/dL (ref 6.5–8.1)

## 2021-11-04 LAB — CBC
HCT: 47.7 % (ref 39.0–52.0)
Hemoglobin: 15.4 g/dL (ref 13.0–17.0)
MCH: 30.1 pg (ref 26.0–34.0)
MCHC: 32.3 g/dL (ref 30.0–36.0)
MCV: 93.2 fL (ref 80.0–100.0)
Platelets: 277 10*3/uL (ref 150–400)
RBC: 5.12 MIL/uL (ref 4.22–5.81)
RDW: 12.5 % (ref 11.5–15.5)
WBC: 10.6 10*3/uL — ABNORMAL HIGH (ref 4.0–10.5)
nRBC: 0 % (ref 0.0–0.2)

## 2021-11-04 LAB — CBG MONITORING, ED: Glucose-Capillary: 123 mg/dL — ABNORMAL HIGH (ref 70–99)

## 2021-11-04 MED ORDER — ONDANSETRON HCL 4 MG/2ML IJ SOLN
4.0000 mg | Freq: Once | INTRAMUSCULAR | Status: AC | PRN
  Administered 2021-11-04: 4 mg via INTRAVENOUS
  Filled 2021-11-04: qty 2

## 2021-11-04 NOTE — ED Triage Notes (Addendum)
Pt with history of seizure, grand mal. Per ems family witnessed grand mal seizure activity tonight. Ems states on their arrival pt was postictal. Pt does not appear so now, no oral trauma noted. Pt states takes lamictal for seizures. Pt denies pain. Pt vomiting, states does have nausea

## 2021-11-04 NOTE — Discharge Instructions (Signed)
Continue to take your Lamictal as prescribed.  Call Dr. Daisy Blossom office on Monday to arrange for follow-up if needed or any medication changes as recommended by Dr. Malvin Johns.  Return to the ER immediately for new or recurrent seizures, weakness, lightheadedness, confusion, persistent vomiting, or any other new or worsening symptoms that concern you.

## 2021-11-04 NOTE — ED Provider Notes (Signed)
[facility] Provider Note    Event Date/Time   First MD Initiated Contact with Patient 11/04/21 2331     (approximate)   History   Seizures   HPI  Martin Gonzalez is a 26 y.o. male with history of a seizure disorder, most recent seizure in April of last year, who presents with a generalized seizure witnessed by his mother.  Lasted about 3 minutes, and then the patient was confused for several minutes and had a few episodes of vomiting.  He received Zofran by EMS.  He is now alert and oriented and denies any acute complaints.  He states that he has been compliant with his Lamictal, but states he has been sleeping poorly recently.  He denies any drug or alcohol use.   Physical Exam   Triage Vital Signs: ED Triage Vitals  Enc Vitals Group     BP 11/04/21 1924 130/87     Pulse Rate 11/04/21 1924 (!) 56     Resp 11/04/21 1924 16     Temp 11/04/21 1924 97.9 F (36.6 C)     Temp Source 11/04/21 1924 Oral     SpO2 11/04/21 1924 95 %     Weight 11/04/21 1925 160 lb (72.6 kg)     Height 11/04/21 1925 5\' 10"  (1.778 m)     Head Circumference --      Peak Flow --      Pain Score 11/04/21 1925 0     Pain Loc --      Pain Edu? --      Excl. in [place]? --     Most recent vital signs: Vitals:   11/04/21 1924 11/04/21 2246  BP: 130/87 122/67  Pulse: (!) 56 62  Resp: 16 18  Temp: 97.9 F (36.6 C) 98.3 F (36.8 C)  SpO2: 95% 100%     General: Alert and oriented x4, well-appearing. CV:  Good peripheral perfusion.  Resp:  Normal effort.  Abd:  No distention.  Other:  EOMI.  PERRLA.  Motor and sensory intact in all extremities.  Normal coordination with no ataxia on finger-to-nose.  No pronator drift.   ED Results / Procedures / Treatments   Labs (all labs ordered are listed, but only abnormal results are displayed) Labs Reviewed  COMPREHENSIVE METABOLIC PANEL - Abnormal; Notable for the following components:      Result Value   Glucose, Bld 150 (*)     Creatinine, Ser 1.32 (*)    Anion gap 16 (*)    All other components within normal limits  CBC - Abnormal; Notable for the following components:   WBC 10.6 (*)    All other components within normal limits  CBG MONITORING, ED - Abnormal; Notable for the following components:   Glucose-Capillary 123 (*)    All other components within normal limits     EKG     RADIOLOGY   PROCEDURES:  Critical Care performed: No  Procedures   MEDICATIONS ORDERED IN ED: Medications  ondansetron (ZOFRAN) injection 4 mg (4 mg Intravenous Given 11/04/21 1931)     IMPRESSION / MDM / ASSESSMENT AND PLAN / ED COURSE  I reviewed the triage vital signs and the nursing notes.  26 year old male with a history of a seizure disorder presents with a seizure this evening with a postictal state.  He is now back to his baseline mental status and denies acute complaints.  On exam his vital signs are normal and he is well-appearing.  Neurologic exam is normal.  I reviewed the past medical records; the patient was last seen by his neurologist Dr. Melrose Nakayama in May of last year.  He takes Lamictal.  Overall presentation is consistent with a breakthrough seizure with known seizure disorder.  If the patient is compliant with his Lamictal, I suspect that this is related to his poor sleep recently.  BMP and CBC were obtained and are unremarkable.  Anion gap is slightly elevated but this is normal after a seizure.  At this time it has been several hours since his seizure.  There is no indication for imaging or further ED work-up.  He is stable for discharge home.  I counseled him on the results of the work-up.  I recommend that he contact his neurologist early in the week and be extra diligent about medication compliance.  I gave him and his mother thorough return precautions and they expressed understanding.   FINAL CLINICAL IMPRESSION(S) / ED DIAGNOSES   Final diagnoses:  Seizure disorder ([place])     Rx / DC  Orders   ED Discharge Orders     None        Note:  This document was prepared using Dragon voice recognition software and may include unintentional dictation errors.    Arta Silence, MD 11/05/21 [id number]

## 2021-11-09 ENCOUNTER — Other Ambulatory Visit: Payer: Self-pay

## 2021-11-09 MED ORDER — DIVALPROEX SODIUM 125 MG PO DR TAB
DELAYED_RELEASE_TABLET | ORAL | 1 refills | Status: DC
  Filled 2021-11-09: qty 60, 30d supply, fill #0
  Filled 2021-12-09: qty 6, 3d supply, fill #1
  Filled 2021-12-09: qty 54, 27d supply, fill #1
  Filled 2021-12-09: qty 60, 30d supply, fill #1

## 2021-11-10 ENCOUNTER — Other Ambulatory Visit: Payer: Self-pay

## 2021-11-11 ENCOUNTER — Other Ambulatory Visit: Payer: Self-pay

## 2021-11-14 ENCOUNTER — Telehealth: Payer: Self-pay

## 2021-11-14 NOTE — Telephone Encounter (Signed)
May schedule for this Friday at 12:30pm,  ?

## 2021-11-14 NOTE — Telephone Encounter (Signed)
Pt had a seizure 11-04-21. Went to ER and has seen Dr Melrose Nakayama (neuro). Both advised he see his PCP. He says he has other items to discuss, also. 1st available was 11-30-21. Pt made that appt but pt wants to be seen sooner. Can he be worked in? ?

## 2021-11-15 NOTE — Telephone Encounter (Signed)
Lvm asking pt to call back.  Need to relay Dr. Timoteo Expose message and schedule ER f/u if pt agrees.  ?

## 2021-11-16 NOTE — Telephone Encounter (Signed)
Looks like pt r/s ER f/u to 11/18/21 at 1:00. ?

## 2021-11-16 NOTE — Telephone Encounter (Signed)
Pt is currently scheduled on 11/30/21 at 12:30.  Need to see if pt can come in earlier. ? ?Lvm asking pt to call back to r/s ER f/u OV for earlier date, if possible. ?

## 2021-11-18 ENCOUNTER — Ambulatory Visit (INDEPENDENT_AMBULATORY_CARE_PROVIDER_SITE_OTHER): Payer: Managed Care, Other (non HMO) | Admitting: Family Medicine

## 2021-11-18 ENCOUNTER — Encounter: Payer: Self-pay | Admitting: Family Medicine

## 2021-11-18 ENCOUNTER — Other Ambulatory Visit: Payer: Self-pay

## 2021-11-18 VITALS — BP 114/70 | HR 92 | Temp 97.9°F | Ht 70.0 in | Wt 167.1 lb

## 2021-11-18 DIAGNOSIS — G40909 Epilepsy, unspecified, not intractable, without status epilepticus: Secondary | ICD-10-CM

## 2021-11-18 DIAGNOSIS — J3089 Other allergic rhinitis: Secondary | ICD-10-CM

## 2021-11-18 DIAGNOSIS — R519 Headache, unspecified: Secondary | ICD-10-CM

## 2021-11-18 DIAGNOSIS — G479 Sleep disorder, unspecified: Secondary | ICD-10-CM | POA: Insufficient documentation

## 2021-11-18 MED ORDER — LORATADINE 10 MG PO TABS: 10.0000 mg | ORAL_TABLET | Freq: Every day | ORAL | Status: DC

## 2021-11-18 NOTE — Progress Notes (Signed)
Patient ID: Martin Gonzalez, male    DOB: December 12, 1995, 26 y.o.   MRN: [id number]  This visit was conducted in person.  BP 114/70    Pulse 92    Temp 97.9 F (36.6 C) (Temporal)    Ht 5\' 10"  (1.778 m)    Wt 167 lb 2 oz (75.8 kg)    SpO2 96%    BMI 23.98 kg/m    CC: ER f/u visit  Subjective:   HPI: Martin Gonzalez is a 26 y.o. male presenting on 11/18/2021 for Hospitalization Follow-up (Seen on 11/04/21 at [facility], dx seizure disorder. )   Recent ER visit for recurrent generalized seizure witnessed by mom. ER records reviewed. CBC with slightly elevated WBC to 10.6, CMP with cbg 150 (nonfasting), Cr 1.32 (GFR >60) otherwise normal. Previous seizure was 12/2020. He had been taking prescribed AED lamictal regularly, but did note poor sleep preceding seizure. Denied alcohol, rec drug use.   Neurology continued lamictal 150mg  BID, started depakote 125mg  BID.  Rash to chest when lamictal dose increased to 200mg  bid.  Keppra caused mood changes.   He notes he is very sensitive to sun or bright lights. This can cause foggy head feeling, lightheadedness, can trigger headaches.   He also notes longstanding difficulty with sleep and sleep initiation insomnia. Significant daytime somnolence. He frequently takes naps during the day. Endorses non-restorative sleep. Endorses snoring but no witnessed apneic episodes. No noted sleep movements.  Poor sleep hygiene - no bedtime routine.  Caffeine - 1 soda intermittently.  Denies alcohol use.   He had nasal septoplasty and inferior turbinate reduction to repair deviated septum with chronic R nasal congestion 01/2021 - 08/2021). Upcoming appt with ENT is 12/2021.  Continues with headaches and nasal congestion despite singulair, flonase, astelin, prior oral antihistamine use. He also intermittently uses sinus wash.      Relevant past medical, surgical, family and social history reviewed and updated as indicated. Interim medical history since our last visit  reviewed. Allergies and medications reviewed and updated. Outpatient Medications Prior to Visit  Medication Sig Dispense Refill   azelastine (ASTELIN) 0.1 % nasal spray Place 1-2 sprays into both nostrils 2 (two) times daily. 30 mL 12   divalproex (DEPAKOTE) 125 MG DR tablet Take 125 mg twice a day 60 tablet 1   fluticasone (FLONASE) 50 MCG/ACT nasal spray Place 2 sprays into both nostrils daily. 16 g 12   HYDROcodone-acetaminophen (NORCO/VICODIN) 5-325 MG tablet Take 1 to 2 tablets by mouth every 6 hours as needed for moderate pain 40 tablet 0   lamoTRIgine (LAMICTAL) 150 MG tablet TAKE 1 TABLET (150 MG TOTAL) BY MOUTH 2 (TWO) TIMES DAILY 60 tablet 8   famotidine (PEPCID) 20 MG tablet TAKE 1 TABLET (20 MG TOTAL) BY MOUTH AT BEDTIME. 30 tablet 3   HYDROcodone-acetaminophen (NORCO/VICODIN) 5-325 MG tablet Take 1-2 tablets by mouth every 6 (six) hours as needed for moderate pain. 40 tablet 0   cephALEXin (KEFLEX) 500 MG capsule Take 1 capsule (500 mg total) by mouth 2 (two) times daily. 20 capsule 0   montelukast (SINGULAIR) 10 MG tablet TAKE 1 TABLET (10 MG TOTAL) BY MOUTH AT BEDTIME. (Patient not taking: Reported on 11/18/2021) 30 tablet 3   predniSONE (DELTASONE) 10 MG tablet Take 6 tabs on day 1, 5 tabs on day 2, 4 tabs on day 3, 3 tabs on day 4, 2 tabs on day 5, 1 tab on day 6. Then stop. 21 tablet 0  No facility-administered medications prior to visit.     Per HPI unless specifically indicated in ROS section below Review of Systems  Objective:  BP 114/70    Pulse 92    Temp 97.9 F (36.6 C) (Temporal)    Ht 5\' 10"  (1.778 m)    Wt 167 lb 2 oz (75.8 kg)    SpO2 96%    BMI 23.98 kg/m   Wt Readings from Last 3 Encounters:  11/18/21 167 lb 2 oz (75.8 kg)  11/04/21 160 lb (72.6 kg)  08/23/21 162 lb (73.5 kg)      Physical Exam Vitals and nursing note reviewed.  Constitutional:      Appearance: Normal appearance. He is not ill-appearing.  Eyes:     Extraocular Movements: Extraocular  movements intact.     Conjunctiva/sclera: Conjunctivae normal.     Pupils: Pupils are equal, round, and reactive to light.  Neck:     Thyroid: No thyroid mass or thyromegaly.  Cardiovascular:     Rate and Rhythm: Normal rate and regular rhythm.     Pulses: Normal pulses.     Heart sounds: Normal heart sounds. No murmur heard. Pulmonary:     Effort: Pulmonary effort is normal.     Breath sounds: Normal breath sounds.  Abdominal:     General: Abdomen is flat. Bowel sounds are normal. There is no distension.     Palpations: Abdomen is soft. There is no mass.     Tenderness: There is no abdominal tenderness. There is no guarding or rebound.     Hernia: No hernia is present.  Musculoskeletal:     Cervical back: Normal range of motion and neck supple.     Right lower leg: No edema.     Left lower leg: No edema.  Skin:    General: Skin is warm and dry.     Findings: No rash.  Neurological:     General: No focal deficit present.     Mental Status: He is alert. Mental status is at baseline.     Cranial Nerves: No cranial nerve deficit.     Motor: Motor function is intact.     Coordination: Coordination is intact. Coordination normal.     Comments:  CN 2-12 intact FTN intact  Psychiatric:        Mood and Affect: Mood normal.        Behavior: Behavior normal.      Results for orders placed or performed during the hospital encounter of 11/04/21  Comprehensive metabolic panel  Result Value Ref Range   Sodium 137 135 - 145 mmol/L   Potassium 3.9 3.5 - 5.1 mmol/L   Chloride 98 98 - 111 mmol/L   CO2 23 22 - 32 mmol/L   Glucose, Bld 150 (H) 70 - 99 mg/dL   BUN 10 6 - 20 mg/dL   Creatinine, Ser 1.611.32 (H) 0.61 - 1.24 mg/dL   Calcium 9.8 8.9 - 09.610.3 mg/dL   Total Protein 7.9 6.5 - 8.1 g/dL   Albumin 4.9 3.5 - 5.0 g/dL   AST 27 15 - 41 U/L   ALT 16 0 - 44 U/L   Alkaline Phosphatase 59 38 - 126 U/L   Total Bilirubin 1.1 0.3 - 1.2 mg/dL   GFR, Estimated >04>60 >54>60 mL/min   Anion gap 16  (H) 5 - 15  CBC  Result Value Ref Range   WBC 10.6 (H) 4.0 - 10.5 K/uL   RBC 5.12 4.22 - 5.81  MIL/uL   Hemoglobin 15.4 13.0 - 17.0 g/dL   HCT 01.0 93.2 - 35.5 %   MCV 93.2 80.0 - 100.0 fL   MCH 30.1 26.0 - 34.0 pg   MCHC 32.3 30.0 - 36.0 g/dL   RDW 73.2 20.2 - 54.2 %   Platelets 277 150 - 400 K/uL   nRBC 0.0 0.0 - 0.2 %  CBG monitoring, ED  Result Value Ref Range   Glucose-Capillary 123 (H) 70 - 99 mg/dL   Comment 1 Notify RN    Comment 2 Document in Chart     Assessment & Plan:  This visit occurred during the SARS-CoV-2 public health emergency.  Safety protocols were in place, including screening questions prior to the visit, additional usage of staff PPE, and extensive cleaning of exam room while observing appropriate contact time as indicated for disinfecting solutions.   Problem List Items Addressed This Visit     Perennial allergic rhinitis    Predominant nasal congestion.  S/p nasal sinus surgery.  Ongoing symptoms. He has April f/u planned with ENT - will ask about allergy eval.       Seizure disorder (HCC) - Primary    Recent breakthrough seizure despite lamictal 150mg  BID. Has been started on depakote 125mg  BID through neurology. Unclear trigger, possibly poor sleep. See below.       Headache    Stable period. Notes photosensitivity with resultant mental fog, lightheadedness, can trigger headaches. Hesitant to add prophylactic medication without neuro input.       Sleeping difficulty    Predominant sleep initiation insomnia issue.  Discussed poor sleep may decrease seizure threshold. Encouraged renewed efforts towards healthy sleep routine. Sleep hygiene handout provided. ESS = 4 ?OSA given symptoms (daytime somnolence, non restorative sleep) however low ESS score. Consider sleep evaluation/study if ongoing difficulty.         Meds ordered this encounter  Medications   loratadine (CLARITIN) 10 MG tablet    Sig: Take 1 tablet (10 mg total) by mouth daily.    No orders of the defined types were placed in this encounter.    Patient Instructions  Work on better sleep schedule - goal at least 7-8 hours of sleep every night. Set bedtime at midnight so you can get up by 8am.  Let know how you do with this.   Sleep hygiene checklist: 1. Avoid naps during the day 2. Avoid stimulants such as caffeine and nicotine. Avoid bedtime alcohol (it can speed onset of sleep but the body's metabolism can cause awakenings). 3. All forms of exercise help ensure sound sleep - limit vigorous exercise to morning or late afternoon 4. Avoid food too close to bedtime including chocolate (which contains caffeine) 5. Soak up natural light 6. Establish regular bedtime routine. 7. Associate bed with sleep - avoid TV, computer or phone, reading while in bed. 8. Ensure pleasant, relaxing sleep environment - quiet, dark, cool room.   Follow up plan: No follow-ups on file.  , MD

## 2021-11-18 NOTE — Assessment & Plan Note (Signed)
Predominant nasal congestion.  ?S/p nasal sinus surgery.  ?Ongoing symptoms. He has April f/u planned with ENT - will ask about allergy eval.  ?

## 2021-11-18 NOTE — Assessment & Plan Note (Addendum)
Predominant sleep initiation insomnia issue.  ?Discussed poor sleep may decrease seizure threshold. Encouraged renewed efforts towards healthy sleep routine. Sleep hygiene handout provided. ?ESS = 4 ??OSA given symptoms (daytime somnolence, non restorative sleep) however low ESS score. Consider sleep evaluation/study if ongoing difficulty.  ?

## 2021-11-18 NOTE — Patient Instructions (Addendum)
Work on better sleep schedule - goal at least 7-8 hours of sleep every night. Set bedtime at midnight so you can get up by 8am.  ?Let us know how you do with this.  ? ?Sleep hygiene checklist: ?1. Avoid naps during the day ?2. Avoid stimulants such as caffeine and nicotine. Avoid bedtime alcohol (it can speed onset of sleep but the body's metabolism can cause awakenings). ?3. All forms of exercise help ensure sound sleep - limit vigorous exercise to morning or late afternoon ?4. Avoid food too close to bedtime including chocolate (which contains caffeine) ?5. Soak up natural light ?6. Establish regular bedtime routine. ?7. Associate bed with sleep - avoid TV, computer or phone, reading while in bed. ?8. Ensure pleasant, relaxing sleep environment - quiet, dark, cool room.  ?

## 2021-11-18 NOTE — Assessment & Plan Note (Addendum)
Stable period. Notes photosensitivity with resultant mental fog, lightheadedness, can trigger headaches. Hesitant to add prophylactic medication without neuro input.  ?

## 2021-11-18 NOTE — Assessment & Plan Note (Signed)
Recent breakthrough seizure despite lamictal 150mg  BID. Has been started on depakote 125mg  BID through neurology. Unclear trigger, possibly poor sleep. See below.  ?

## 2021-11-30 ENCOUNTER — Ambulatory Visit: Payer: Managed Care, Other (non HMO) | Admitting: Family Medicine

## 2021-12-09 ENCOUNTER — Other Ambulatory Visit: Payer: Self-pay

## 2021-12-12 ENCOUNTER — Other Ambulatory Visit: Payer: Self-pay

## 2022-01-09 ENCOUNTER — Other Ambulatory Visit: Payer: Self-pay

## 2022-01-10 ENCOUNTER — Other Ambulatory Visit: Payer: Self-pay

## 2022-01-10 MED ORDER — DIVALPROEX SODIUM 125 MG PO DR TAB
DELAYED_RELEASE_TABLET | ORAL | 1 refills | Status: DC
  Filled 2022-01-10: qty 180, 90d supply, fill #0
  Filled 2022-04-12: qty 180, 90d supply, fill #1

## 2022-01-10 MED ORDER — LAMOTRIGINE 150 MG PO TABS
150.0000 mg | ORAL_TABLET | Freq: Two times a day (BID) | ORAL | 1 refills | Status: DC
  Filled 2022-01-10: qty 180, 90d supply, fill #0
  Filled 2022-04-12: qty 120, 60d supply, fill #0
  Filled 2022-04-12: qty 60, 30d supply, fill #0
  Filled 2022-07-06: qty 180, 90d supply, fill #1

## 2022-01-11 ENCOUNTER — Other Ambulatory Visit: Payer: Self-pay

## 2022-02-09 ENCOUNTER — Other Ambulatory Visit: Payer: Self-pay

## 2022-03-13 ENCOUNTER — Other Ambulatory Visit: Payer: Self-pay

## 2022-04-12 ENCOUNTER — Other Ambulatory Visit: Payer: Self-pay

## 2022-04-17 ENCOUNTER — Ambulatory Visit (INDEPENDENT_AMBULATORY_CARE_PROVIDER_SITE_OTHER): Payer: Commercial Managed Care - HMO | Admitting: Family Medicine

## 2022-04-17 ENCOUNTER — Encounter: Payer: Self-pay | Admitting: Family Medicine

## 2022-04-17 VITALS — BP 118/80 | HR 98 | Temp 97.4°F | Ht 70.0 in | Wt 159.0 lb

## 2022-04-17 DIAGNOSIS — F0630 Mood disorder due to known physiological condition, unspecified: Secondary | ICD-10-CM

## 2022-04-17 DIAGNOSIS — R440 Auditory hallucinations: Secondary | ICD-10-CM | POA: Diagnosis not present

## 2022-04-17 DIAGNOSIS — G40909 Epilepsy, unspecified, not intractable, without status epilepticus: Secondary | ICD-10-CM | POA: Diagnosis not present

## 2022-04-17 DIAGNOSIS — R6889 Other general symptoms and signs: Secondary | ICD-10-CM

## 2022-04-17 DIAGNOSIS — R21 Rash and other nonspecific skin eruption: Secondary | ICD-10-CM | POA: Diagnosis not present

## 2022-04-17 DIAGNOSIS — Z113 Encounter for screening for infections with a predominantly sexual mode of transmission: Secondary | ICD-10-CM

## 2022-04-17 LAB — CBC WITH DIFFERENTIAL/PLATELET
Basophils Absolute: 0 10*3/uL (ref 0.0–0.1)
Basophils Relative: 0.6 % (ref 0.0–3.0)
Eosinophils Absolute: 0 10*3/uL (ref 0.0–0.7)
Eosinophils Relative: 0.4 % (ref 0.0–5.0)
HCT: 45.5 % (ref 39.0–52.0)
Hemoglobin: 15.3 g/dL (ref 13.0–17.0)
Lymphocytes Relative: 33.5 % (ref 12.0–46.0)
Lymphs Abs: 1.6 10*3/uL (ref 0.7–4.0)
MCHC: 33.7 g/dL (ref 30.0–36.0)
MCV: 92.8 fl (ref 78.0–100.0)
Monocytes Absolute: 0.6 10*3/uL (ref 0.1–1.0)
Monocytes Relative: 12.5 % — ABNORMAL HIGH (ref 3.0–12.0)
Neutro Abs: 2.6 10*3/uL (ref 1.4–7.7)
Neutrophils Relative %: 53 % (ref 43.0–77.0)
Platelets: 242 10*3/uL (ref 150.0–400.0)
RBC: 4.9 Mil/uL (ref 4.22–5.81)
RDW: 13.5 % (ref 11.5–15.5)
WBC: 4.9 10*3/uL (ref 4.0–10.5)

## 2022-04-17 LAB — TSH: TSH: 0.51 u[IU]/mL (ref 0.35–5.50)

## 2022-04-17 LAB — T4, FREE: Free T4: 0.92 ng/dL (ref 0.60–1.60)

## 2022-04-17 LAB — SEDIMENTATION RATE: Sed Rate: 3 mm/hr (ref 0–15)

## 2022-04-17 NOTE — Progress Notes (Unsigned)
Patient ID: Martin Gonzalez, male    DOB: Sep 14, 1995, 26 y.o.   MRN: [id number]  This visit was conducted in person.  BP 118/80   Pulse 98   Temp (!) 97.4 F (36.3 C) (Temporal)   Ht 5\' 10"  (1.778 m)   Wt 159 lb (72.1 kg)   SpO2 98%   BMI 22.81 kg/m    CC: rash, auditory hallucinations  Subjective:   HPI: Martin Gonzalez is a 26 y.o. male presenting on 04/17/2022 for Rash (C/o rash on anterior torso.  Noticed about 1 wk ago.  H/o similar sxs. ) and Hallucinations (C/o auditory hallucinations.  Started mos ago. )   1 wk h/o rash to chest and abdomen. Red streaks to chest and stomach.  No new lotions, detergents, soaps or shampoos. No new medicines, foods, supplements. No fever, abd pain.   Notes worsening migraines as well as nausea - worsened photophobia - has to wear shades, even in the house, as well as worsened dizziness. Notes easy heat intolerance for years. Even watching TV irritates eyes.  Currently living at [facility] house - lots of dust and possibly mold exposure.   Hallucinations - started months ago, hearing ppl arguing even when he's alone, can see figures moving in the room. This causes anxiety. General health issues cause depressed mood. Notes mood swings, irritability. No paranoid or delusional thoughts.  No active SI/HI. Passive SI.  No alcohol.  No rec drugs.  Sleeping better - more consistent bedtime routine.  Father may be bipolar.   H/o seizures, he is on depakote 125mg  bid as well as lamictal 150mg  bid. Last saw neurology 11/2021 at which time depakote was started, upcoming appt 05/2022.  Last brain MRI 07/2019 - stable non contrast MRI of brain.   Prior rash to chest when lamictal dose increased to 200mg  bid.  Keppra caused mood changes.      Relevant past medical, surgical, family and social history reviewed and updated as indicated. Interim medical history since our last visit reviewed. Allergies and medications reviewed and updated. Outpatient Medications  Prior to Visit  Medication Sig Dispense Refill   divalproex (DEPAKOTE) 125 MG DR tablet Take 1 tablet by mouth twice a day 180 tablet 1   lamoTRIgine (LAMICTAL) 150 MG tablet TAKE 1 TABLET  BY MOUTH 2 TIMES DAILY 180 tablet 1   divalproex (DEPAKOTE) 125 MG DR tablet Take 125 mg twice a day 60 tablet 1   lamoTRIgine (LAMICTAL) 150 MG tablet TAKE 1 TABLET (150 MG TOTAL) BY MOUTH 2 (TWO) TIMES DAILY 60 tablet 8   azelastine (ASTELIN) 0.1 % nasal spray Place 1-2 sprays into both nostrils 2 (two) times daily. 30 mL 12   fluticasone (FLONASE) 50 MCG/ACT nasal spray Place 2 sprays into both nostrils daily. 16 g 12   HYDROcodone-acetaminophen (NORCO/VICODIN) 5-325 MG tablet Take 1 to 2 tablets by mouth every 6 hours as needed for moderate pain 40 tablet 0   loratadine (CLARITIN) 10 MG tablet Take 1 tablet (10 mg total) by mouth daily.     No facility-administered medications prior to visit.     Per HPI unless specifically indicated in ROS section below Review of Systems  Objective:  BP 118/80   Pulse 98   Temp (!) 97.4 F (36.3 C) (Temporal)   Ht 5\' 10"  (1.778 m)   Wt 159 lb (72.1 kg)   SpO2 98%   BMI 22.81 kg/m   Wt Readings from Last 3 Encounters:  04/17/22 159  lb (72.1 kg)  11/18/21 167 lb 2 oz (75.8 kg)  11/04/21 160 lb (72.6 kg)      Physical Exam Vitals and nursing note reviewed.  Constitutional:      Appearance: Normal appearance. He is not ill-appearing.  HENT:     Head: Normocephalic and atraumatic.     Mouth/Throat:     Mouth: Mucous membranes are moist.     Pharynx: Oropharynx is clear. No oropharyngeal exudate or posterior oropharyngeal erythema.  Eyes:     Extraocular Movements: Extraocular movements intact.     Conjunctiva/sclera: Conjunctivae normal.     Pupils: Pupils are equal, round, and reactive to light.  Cardiovascular:     Rate and Rhythm: Normal rate and regular rhythm.     Pulses: Normal pulses.     Heart sounds: Normal heart sounds. No murmur  heard. Pulmonary:     Effort: Pulmonary effort is normal. No respiratory distress.     Breath sounds: Normal breath sounds. No wheezing, rhonchi or rales.  Musculoskeletal:     Right lower leg: No edema.     Left lower leg: No edema.  Skin:    General: Skin is warm and dry.     Findings: No erythema or rash.     Comments:  No rash appreciated to anterior trunk Hypopigmented macular rash throughout back  Neurological:     Mental Status: He is alert.     Cranial Nerves: Cranial nerves 2-12 are intact.     Sensory: Sensation is intact.     Motor: Motor function is intact.     Coordination: Coordination is intact. Romberg sign negative.     Comments:  CN 2-12 intact FTN intact EOMI No pronator drift  Psychiatric:        Mood and Affect: Mood normal.        Behavior: Behavior normal.        Thought Content: Thought content normal.        Judgment: Judgment normal.       Results for orders placed or performed in visit on 04/17/22  TSH  Result Value Ref Range   TSH 0.51 0.35 - 5.50 uIU/mL  T4, free  Result Value Ref Range   Free T4 0.92 0.60 - 1.60 ng/dL  CBC with Differential/Platelet  Result Value Ref Range   WBC 4.9 4.0 - 10.5 K/uL   RBC 4.90 4.22 - 5.81 Mil/uL   Hemoglobin 15.3 13.0 - 17.0 g/dL   HCT 42.6 83.4 - 19.6 %   MCV 92.8 78.0 - 100.0 fl   MCHC 33.7 30.0 - 36.0 g/dL   RDW 22.2 97.9 - 89.2 %   Platelets 242.0 150.0 - 400.0 K/uL   Neutrophils Relative % 53.0 43.0 - 77.0 %   Lymphocytes Relative 33.5 12.0 - 46.0 %   Monocytes Relative 12.5 (H) 3.0 - 12.0 %   Eosinophils Relative 0.4 0.0 - 5.0 %   Basophils Relative 0.6 0.0 - 3.0 %   Neutro Abs 2.6 1.4 - 7.7 K/uL   Lymphs Abs 1.6 0.7 - 4.0 K/uL   Monocytes Absolute 0.6 0.1 - 1.0 K/uL   Eosinophils Absolute 0.0 0.0 - 0.7 K/uL   Basophils Absolute 0.0 0.0 - 0.1 K/uL  Sedimentation rate  Result Value Ref Range   Sed Rate 3 0 - 15 mm/hr  Basic metabolic panel  Result Value Ref Range   Sodium 139 135 - 145  mEq/L   Potassium 4.2 3.5 - 5.1 mEq/L  Chloride 99 96 - 112 mEq/L   CO2 28 19 - 32 mEq/L   Glucose, Bld 54 (L) 70 - 99 mg/dL   BUN 12 6 - 23 mg/dL   Creatinine, Ser 8.25 0.40 - 1.50 mg/dL   GFR 05.39 >76.73 mL/min   Calcium 10.0 8.4 - 10.5 mg/dL      12/10/9377   02:40 PM 06/28/2020    3:05 PM 05/12/2020    3:54 PM  Depression screen PHQ 2/9  Decreased Interest 3 0 0  Down, Depressed, Hopeless 3 0 0  PHQ - 2 Score 6 0 0  Altered sleeping 3    Tired, decreased energy 3    Change in appetite 2    Feeling bad or failure about yourself  3    Trouble concentrating 1    Moving slowly or fidgety/restless 2    Suicidal thoughts 1    PHQ-9 Score 21    Difficult doing work/chores Somewhat difficult        04/17/2022   12:46 PM 05/12/2020    3:54 PM  GAD 7 : Generalized Anxiety Score  Nervous, Anxious, on Edge 3 0  Control/stop worrying 3 0  Worry too much - different things 3 0  Trouble relaxing 1 0  Restless 3 0  Easily annoyed or irritable 1 0  Afraid - awful might happen 3 0  Total GAD 7 Score 17 0  Anxiety Difficulty Somewhat difficult Not difficult at all   MDQ = 12, positive screen   Assessment & Plan:   Problem List Items Addressed This Visit     Seizure disorder (HCC)    Concern medications are causing side effects of rash and possible auditory/visual hallucinations (specifically lamictal which was latest start). I advised he contact neurology to see if medication change is in order. Currently on low dose depakote 125mg  bid as well as lamictal 150mg  bid.       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 Now with possible psychosis symptoms of auditory/visual hallucinations. Concern for seizure vs med related changes - I did ask him to reach out to neurology to discuss  medication changes. Lamictal was most recently added, this could be associated with hallucinations.  He may have fmhx bipolar (father) however today's symptoms not consistent with this.  Check labs for other reversible causes including thyroid.  Will see if we can add RPR to blood in lab.  Support provided, recommend counseling referral, discussed possible mood medication likely SSRI however would want neuro input first in setting of seizures.       Relevant Orders   TSH (Completed)   T4, free (Completed)   CBC with Differential/Platelet (Completed)   Ambulatory referral to Psychology   Skin rash    None currently present however rash is common side effect to lamictal - see above.       Relevant Orders   CBC with Differential/Platelet (Completed)   Sedimentation rate (Completed)   Basic metabolic panel (Completed)   Other Visit Diagnoses     Auditory hallucinations       Heat intolerance       Relevant Orders   TSH (Completed)   T4, free (Completed)   CBC with Differential/Platelet (Completed)   Sedimentation rate (Completed)        No orders of  the defined types were placed in this encounter.  Orders Placed This Encounter  Procedures   TSH   T4, free   CBC with Differential/Platelet   Sedimentation rate   Basic metabolic panel   Ambulatory referral to Psychology    Referral Priority:   Routine    Referral Type:   Psychiatric    Referral Reason:   Specialty Services Required    Requested Specialty:   Psychology    Number of Visits Requested:   1    Patient Instructions  Labs today.  Mood questionnaires today  Call neurology Dr Malvin Johns to schedule sooner appointment - seizure medicines may need to be adjusted.  We will refer you to counselor.   Return in 3 months for follow up visit.   Follow up plan: Return in about 3 months (around 07/18/2022) for follow up visit.  [name], MD

## 2022-04-17 NOTE — Patient Instructions (Addendum)
Labs today.  Mood questionnaires today  Call neurology Dr Malvin Johns to schedule sooner appointment - seizure medicines may need to be adjusted.  We will refer you to counselor.   Return in 3 months for follow up visit.

## 2022-04-18 ENCOUNTER — Other Ambulatory Visit: Payer: Commercial Managed Care - HMO

## 2022-04-18 ENCOUNTER — Telehealth: Payer: Self-pay | Admitting: Family Medicine

## 2022-04-18 DIAGNOSIS — Z113 Encounter for screening for infections with a predominantly sexual mode of transmission: Secondary | ICD-10-CM

## 2022-04-18 DIAGNOSIS — R21 Rash and other nonspecific skin eruption: Secondary | ICD-10-CM | POA: Insufficient documentation

## 2022-04-18 DIAGNOSIS — F0630 Mood disorder due to known physiological condition, unspecified: Secondary | ICD-10-CM | POA: Insufficient documentation

## 2022-04-18 LAB — BASIC METABOLIC PANEL
BUN: 12 mg/dL (ref 6–23)
CO2: 28 mEq/L (ref 19–32)
Calcium: 10 mg/dL (ref 8.4–10.5)
Chloride: 99 mEq/L (ref 96–112)
Creatinine, Ser: 1.39 mg/dL (ref 0.40–1.50)
GFR: 70.45 mL/min (ref >60.00)
Glucose, Bld: 54 mg/dL — ABNORMAL LOW (ref 70–99)
Potassium: 4.2 mEq/L (ref 3.5–5.1)
Sodium: 139 mEq/L (ref 135–145)

## 2022-04-18 NOTE — Assessment & Plan Note (Addendum)
Concern medications are causing side effects of rash and possible auditory/visual hallucinations (specifically lamictal which was latest start). I advised he contact neurology to see if medication change is in order. Currently on low dose depakote 125mg  bid as well as lamictal 150mg  bid.

## 2022-04-18 NOTE — Assessment & Plan Note (Addendum)
Notes progressive worsening mood (anxiety > depression) in setting of at times disabling medical condition of seizures, as well as worsening headaches, dizziness, heat intolerance. Now with possible psychosis symptoms of auditory/visual hallucinations. Concern for seizure vs med related changes - I did ask him to reach out to neurology to discuss medication changes. Lamictal was most recently added, this could be associated with hallucinations.  He may have fmhx bipolar (father) however today's symptoms not consistent with this.  Check labs for other reversible causes including thyroid.  Will see if we can add RPR to blood in lab.  Support provided, recommend counseling referral, discussed possible mood medication likely SSRI however would want neuro input first in setting of seizures.

## 2022-04-18 NOTE — Addendum Note (Signed)
Addended by: [name] on: 04/18/2022 07:25 AM   Modules accepted: Orders

## 2022-04-18 NOTE — Telephone Encounter (Signed)
Please forward yesterday's office visit attention Dr. Malvin Johns with [facility] neurology. I asked patient to contact them to schedule sooner appointment as I think he needs evaluation and possible med change specifically Lamictal.  I have also referred him for counseling.

## 2022-04-18 NOTE — Assessment & Plan Note (Signed)
None currently present however rash is common side effect to lamictal - see above.

## 2022-04-18 NOTE — Telephone Encounter (Signed)
Faxed 04/17/22 OV notes via Epic to Dr. Theora Master.

## 2022-04-18 NOTE — Addendum Note (Signed)
Addended by: Alvina Chou on: 04/18/2022 09:45 AM   Modules accepted: Orders

## 2022-04-19 LAB — RPR: RPR Ser Ql: NONREACTIVE

## 2022-04-28 ENCOUNTER — Other Ambulatory Visit: Payer: Self-pay

## 2022-07-06 ENCOUNTER — Other Ambulatory Visit: Payer: Self-pay

## 2022-07-07 ENCOUNTER — Other Ambulatory Visit: Payer: Self-pay

## 2022-07-07 MED ORDER — DIVALPROEX SODIUM 125 MG PO DR TAB
DELAYED_RELEASE_TABLET | ORAL | 5 refills | Status: DC
  Filled 2022-07-07: qty 60, 30d supply, fill #0
  Filled 2022-08-10: qty 20, 10d supply, fill #1
  Filled 2022-08-10: qty 40, 20d supply, fill #1
  Filled 2022-09-11: qty 60, 30d supply, fill #2
  Filled 2022-10-10: qty 60, 30d supply, fill #3
  Filled 2022-11-13: qty 60, 30d supply, fill #4

## 2022-07-10 ENCOUNTER — Other Ambulatory Visit: Payer: Self-pay

## 2022-07-11 ENCOUNTER — Other Ambulatory Visit: Payer: Self-pay

## 2022-07-14 ENCOUNTER — Other Ambulatory Visit: Payer: Self-pay

## 2022-07-14 MED ORDER — DIVALPROEX SODIUM 125 MG PO DR TAB
125.0000 mg | DELAYED_RELEASE_TABLET | Freq: Two times a day (BID) | ORAL | 1 refills | Status: DC
  Filled 2022-12-13: qty 180, 90d supply, fill #0
  Filled 2023-03-21: qty 180, 90d supply, fill #1

## 2022-07-18 ENCOUNTER — Ambulatory Visit: Payer: Commercial Managed Care - HMO | Admitting: Family Medicine

## 2022-07-26 ENCOUNTER — Ambulatory Visit (INDEPENDENT_AMBULATORY_CARE_PROVIDER_SITE_OTHER): Payer: Commercial Managed Care - HMO | Admitting: *Deleted

## 2022-07-26 DIAGNOSIS — Z23 Encounter for immunization: Secondary | ICD-10-CM | POA: Diagnosis not present

## 2022-08-10 ENCOUNTER — Other Ambulatory Visit: Payer: Self-pay

## 2022-09-28 ENCOUNTER — Other Ambulatory Visit: Payer: Self-pay

## 2022-09-29 ENCOUNTER — Other Ambulatory Visit: Payer: Self-pay

## 2022-10-02 ENCOUNTER — Other Ambulatory Visit: Payer: Self-pay

## 2022-10-02 MED ORDER — LAMOTRIGINE 100 MG PO TABS
100.0000 mg | ORAL_TABLET | Freq: Two times a day (BID) | ORAL | 1 refills | Status: DC
  Filled 2022-10-02: qty 60, 30d supply, fill #0

## 2022-10-10 ENCOUNTER — Other Ambulatory Visit: Payer: Self-pay

## 2022-10-10 MED ORDER — LAMOTRIGINE 100 MG PO TABS
100.0000 mg | ORAL_TABLET | Freq: Two times a day (BID) | ORAL | 3 refills | Status: DC
  Filled 2022-10-10: qty 180, 90d supply, fill #0
  Filled 2023-02-01: qty 180, 90d supply, fill #1

## 2022-10-10 MED ORDER — DIVALPROEX SODIUM 125 MG PO DR TAB
125.0000 mg | DELAYED_RELEASE_TABLET | Freq: Two times a day (BID) | ORAL | 3 refills | Status: DC
  Filled 2022-10-10: qty 180, 90d supply, fill #0

## 2022-10-11 ENCOUNTER — Other Ambulatory Visit: Payer: Self-pay

## 2022-10-16 ENCOUNTER — Other Ambulatory Visit: Payer: Self-pay

## 2022-11-13 ENCOUNTER — Other Ambulatory Visit: Payer: Self-pay

## 2022-12-13 ENCOUNTER — Other Ambulatory Visit: Payer: Self-pay

## 2022-12-15 ENCOUNTER — Other Ambulatory Visit (HOSPITAL_COMMUNITY): Payer: Self-pay

## 2023-02-01 ENCOUNTER — Other Ambulatory Visit: Payer: Self-pay

## 2023-02-02 ENCOUNTER — Other Ambulatory Visit: Payer: Self-pay

## 2023-03-21 ENCOUNTER — Other Ambulatory Visit: Payer: Self-pay

## 2023-03-22 ENCOUNTER — Other Ambulatory Visit: Payer: Self-pay

## 2023-04-02 ENCOUNTER — Telehealth: Payer: Self-pay | Admitting: Family Medicine

## 2023-04-02 NOTE — Telephone Encounter (Signed)
Hannah at front desk saw note after rescheduling pt for 04/10/23. I spoke with pt and he got neurology appt [place] for 04/04/23 at 12:45. Pt said he could probably get to [facility] on 04/04/23 by 1:45. Pt would like both appts same day. Pt scheduled appt with Dr Reece Agar on 04/04/23 at 2 pm and pt to arrive by 1:45 . ED precautions given again and pt voiced understanding and appreciative of call. Sending note to  Dr Reece Agar and G pool.

## 2023-04-02 NOTE — Telephone Encounter (Signed)
Will see then. 

## 2023-04-02 NOTE — Telephone Encounter (Signed)
Pt called stating on 7/17 he has a seizure in his friend's bathroom. Pt stated he doesn't know how long siezure lasted. Pt states he has scratches on right arm, elbow, & finger from falling. Pt states when the seizure happened, his friend was asleep, once his friend woke up, he knocked on the bathroom door to ask if he was okay & that is what eventually woke him up from the seizure. Scheduled pt with Dr. Reece Agar on Wed, 7/24. Spoke to Dr. Reece Agar to see if pt needed to be triaged, he stated to schedule pt a ov asap & requested for pt to contact his neurologist regarding recent seizure. Also per Dr. Timoteo Expose request, Informed pt to visit the ER if he begans to experience another seizure or seizure like symptoms. Call back # [phone]

## 2023-04-02 NOTE — Telephone Encounter (Signed)
Noted  

## 2023-04-04 ENCOUNTER — Other Ambulatory Visit: Payer: Self-pay

## 2023-04-04 ENCOUNTER — Ambulatory Visit: Payer: Commercial Managed Care - HMO | Admitting: Family Medicine

## 2023-04-04 ENCOUNTER — Ambulatory Visit: Payer: Medicaid Other | Admitting: Family Medicine

## 2023-04-04 MED ORDER — LAMOTRIGINE 100 MG PO TABS
100.0000 mg | ORAL_TABLET | Freq: Two times a day (BID) | ORAL | 3 refills | Status: DC
  Filled 2023-04-04 – 2023-05-04 (×2): qty 180, 90d supply, fill #0
  Filled 2023-05-28 – 2023-08-01 (×2): qty 180, 90d supply, fill #1
  Filled 2023-11-01: qty 180, 90d supply, fill #2
  Filled 2023-12-31 – 2024-02-07 (×2): qty 180, 90d supply, fill #3
  Filled ????-??-??: fill #3

## 2023-04-04 MED ORDER — CITALOPRAM HYDROBROMIDE 20 MG PO TABS
20.0000 mg | ORAL_TABLET | Freq: Every day | ORAL | 1 refills | Status: AC
  Filled 2023-04-04: qty 90, 90d supply, fill #0

## 2023-04-04 MED ORDER — DIVALPROEX SODIUM 125 MG PO DR TAB
125.0000 mg | DELAYED_RELEASE_TABLET | Freq: Two times a day (BID) | ORAL | 3 refills | Status: AC
  Filled 2023-04-04 – 2023-06-21 (×2): qty 180, 90d supply, fill #0
  Filled 2023-09-24: qty 180, 90d supply, fill #1
  Filled 2023-12-31 – 2024-01-01 (×2): qty 180, 90d supply, fill #2

## 2023-04-10 ENCOUNTER — Ambulatory Visit: Payer: Commercial Managed Care - HMO | Admitting: Family Medicine

## 2023-04-13 ENCOUNTER — Encounter: Payer: Self-pay | Admitting: Family Medicine

## 2023-04-13 ENCOUNTER — Ambulatory Visit: Payer: Medicaid Other | Admitting: Family Medicine

## 2023-04-13 VITALS — BP 110/64 | HR 67 | Temp 97.8°F | Ht 70.0 in | Wt 176.1 lb

## 2023-04-13 DIAGNOSIS — E538 Deficiency of other specified B group vitamins: Secondary | ICD-10-CM

## 2023-04-13 DIAGNOSIS — G40909 Epilepsy, unspecified, not intractable, without status epilepticus: Secondary | ICD-10-CM

## 2023-04-13 DIAGNOSIS — F0630 Mood disorder due to known physiological condition, unspecified: Secondary | ICD-10-CM

## 2023-04-13 MED ORDER — CYANOCOBALAMIN 500 MCG PO TABS: 500.0000 ug | ORAL_TABLET | Freq: Every day | ORAL | Status: AC

## 2023-04-13 NOTE — Assessment & Plan Note (Signed)
Low b12 - start daily for 2 wks then maintain with twice weekly dosing.

## 2023-04-13 NOTE — Patient Instructions (Addendum)
Recent vitamin B12 was borderline low - start vitamin B12 over the counter daily for 2 weeks then 3 days a week (Monday Wednesday Friday).  I agree get weekly pill box.  Keep working on [employer].  Good to see you today.

## 2023-04-13 NOTE — Progress Notes (Signed)
Ph: [phone] Fax: [phone]   Patient ID: Martin Gonzalez, male    DOB: 12/13/1995, 27 y.o.   MRN: [id number]  This visit was conducted in person.  BP 110/64   Pulse 67   Temp 97.8 F (36.6 C) (Temporal)   Ht 5\' 10"  (1.778 m)   Wt 176 lb 2 oz (79.9 kg)   SpO2 98%   BMI 25.27 kg/m    CC: seizure f/u visit  Subjective:   HPI: [name] is a 27 y.o. male presenting on 04/13/2023 for Medical Management of Chronic Issues (Here for seizure f/u. Most recent seizure- 03/28/23.)   Suffered recurrent seizure 03/28/2023 while at friend's house - with tongue biting and post-ictal confusion. Prior seizure was 11/04/2021. Doesn't think he had missed any AED doses.   Saw neurology Dr Malvin Johns in follow up 04/04/2023 - rec continue depakote 125mg  BID, continue lamictal 100mg  BID, added celexa 20mg  daily. Labwork ordered as well (TSH, B12, folate, CBC, CMP, and drug levels). Rec against driving x 6 months from latest seizure. Labs returned showing normal TSH, CBC, CMP (glu 69). B12 was low normal at 286. Folate pending.   Previous AED tried: Lamictal 200mg  BID caused rash to chest Keppra caused mood changes   Notes headaches are better this week.   He states he's not started celexa, denies depressed mood.  Does note occasional bad thoughts - but related to current life situation. Denies SI/HI.   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 3.8 GPA this semester!  Working for [employer]. Also has disability for seizure.   No alcohol.  Non smoker.  Rec drugs: denies MJ use.   Diet: 2-3 meals a day, staying well hydrated  Activity: stretching, walking dog (pitbull)     Relevant past medical, surgical, family and social history reviewed and updated as indicated. Interim medical history since our last visit reviewed. Allergies and medications reviewed and updated. Outpatient Medications Prior to Visit  Medication Sig Dispense Refill    divalproex (DEPAKOTE) 125 MG DR tablet Take 1 tablet (125 mg total) by mouth 2 (two) times daily. 180 tablet 3   lamoTRIgine (LAMICTAL) 100 MG tablet Take 1 tablet (100 mg total) by mouth 2 (two) times daily. 180 tablet 3   divalproex (DEPAKOTE) 125 MG DR tablet Take 1 tablet by mouth twice a day 180 tablet 1   divalproex (DEPAKOTE) 125 MG DR tablet Take 1 tablet (125 mg total) by mouth 2 (two) times daily 60 tablet 5   divalproex (DEPAKOTE) 125 MG DR tablet Take 1 tablet (125 mg total) by mouth 2 (two) times daily. 180 tablet 1   divalproex (DEPAKOTE) 125 MG DR tablet Take 1 tablet (125 mg total) by mouth 2 (two) times daily. 180 tablet 3   lamoTRIgine (LAMICTAL) 100 MG tablet Take 1 tablet (100 mg total) by mouth 2 (two) times daily 60 tablet 1   lamoTRIgine (LAMICTAL) 100 MG tablet Take 1 tablet (100 mg total) by mouth 2 (two) times daily. 180 tablet 3   lamoTRIgine (LAMICTAL) 150 MG tablet TAKE 1 TABLET  BY MOUTH 2 TIMES DAILY 180 tablet 1   citalopram (CELEXA) 20 MG tablet Take 1 tablet (20 mg total) by mouth daily. (Patient not taking: Reported on 04/13/2023) 90 tablet 1   No facility-administered medications prior to visit.     Per HPI unless specifically indicated in ROS section below Review of Systems  Objective:  BP 110/64   Pulse  67   Temp 97.8 F (36.6 C) (Temporal)   Ht 5\' 10"  (1.778 m)   Wt 176 lb 2 oz (79.9 kg)   SpO2 98%   BMI 25.27 kg/m   Wt Readings from Last 3 Encounters:  04/13/23 176 lb 2 oz (79.9 kg)  04/17/22 159 lb (72.1 kg)  11/18/21 167 lb 2 oz (75.8 kg)      Physical Exam Vitals and nursing note reviewed.  Constitutional:      Appearance: Normal appearance. He is not ill-appearing.  HENT:     Head: Normocephalic and atraumatic.     Mouth/Throat:     Mouth: Mucous membranes are moist.     Pharynx: Oropharynx is clear. No oropharyngeal exudate or posterior oropharyngeal erythema.  Eyes:     Extraocular Movements: Extraocular movements intact.      Conjunctiva/sclera: Conjunctivae normal.     Pupils: Pupils are equal, round, and reactive to light.  Neck:     Thyroid: No thyroid mass or thyromegaly.  Cardiovascular:     Rate and Rhythm: Normal rate and regular rhythm.     Pulses: Normal pulses.     Heart sounds: Normal heart sounds. No murmur heard. Pulmonary:     Effort: Pulmonary effort is normal. No respiratory distress.     Breath sounds: Normal breath sounds. No wheezing, rhonchi or rales.  Abdominal:     General: Bowel sounds are normal. There is no distension.     Palpations: Abdomen is soft. There is no mass.     Tenderness: There is no abdominal tenderness. There is no guarding or rebound.     Hernia: No hernia is present.  Musculoskeletal:     Cervical back: Normal range of motion and neck supple.     Right lower leg: No edema.     Left lower leg: No edema.  Skin:    General: Skin is warm and dry.     Findings: No rash.  Neurological:     Mental Status: He is alert.  Psychiatric:        Mood and Affect: Mood normal.        Behavior: Behavior normal.       Results for orders placed or performed in visit on 04/18/22  RPR  Result Value Ref Range   RPR Ser Ql NON-REACTIVE NON-REACTIVE    MRI BRAIN WO 10/05/2018 IMPRESSION: Stable and normal noncontrast MRI appearance of the brain.  EEG 10/04/2018 IMPRESSION: This 3 hours 9 minute intermittently monitored EEG with video in the awake and asleep states is within normal limits.      04/13/2023   10:58 AM 04/17/2022   12:46 PM 06/28/2020    3:05 PM 05/12/2020    3:54 PM  Depression screen PHQ 2/9  Decreased Interest 1 3 0 0  Down, Depressed, Hopeless 1 3 0 0  PHQ - 2 Score 2 6 0 0  Altered sleeping 2 3    Tired, decreased energy 1 3    Change in appetite 1 2    Feeling bad or failure about yourself  2 3    Trouble concentrating 1 1    Moving slowly or fidgety/restless 1 2    Suicidal thoughts 1 1    PHQ-9 Score 11 21    Difficult doing work/chores Somewhat  difficult Somewhat difficult         04/13/2023   10:58 AM 04/17/2022   12:46 PM 05/12/2020    3:54 PM  GAD 7 :  Generalized Anxiety Score  Nervous, Anxious, on Edge 1 3 0  Control/stop worrying 1 3 0  Worry too much - different things 1 3 0  Trouble relaxing 1 1 0  Restless 0 3 0  Easily annoyed or irritable 1 1 0  Afraid - awful might happen 1 3 0  Total GAD 7 Score 6 17 0  Anxiety Difficulty Somewhat difficult Somewhat difficult Not difficult at all   Assessment & Plan:   Problem List Items Addressed This Visit     Seizure disorder (HCC) - Primary    Recent breakthrough seizure possibly due to missed AED dose - pt unsure if missed dose. He is planning to buy weekly pill box to ensure he takes each dose - I encouraged him to do this.  He is also motivated to work on [employer] and lifestyle choices.  He is aware to avoid driving until seizure free for 6 months.  He will continue lamictal 100mg  BID and depakote 125mg  BID.  He will keep neurology f/u 09/2023.       Mood disorder due to a general medical condition    He is considering starting antidepressant prescribed by neurology.       Low serum vitamin B12    Low b12 - start daily for 2 wks then maintain with twice weekly dosing.         Meds ordered this encounter  Medications   cyanocobalamin (VITAMIN B12) 500 MCG tablet    Sig: Take 1 tablet (500 mcg total) by mouth daily.    No orders of the defined types were placed in this encounter.   Patient Instructions  Recent vitamin B12 was borderline low - start vitamin B12 over the counter daily for 2 weeks then 3 days a week (Monday Wednesday Friday).  I agree get weekly pill box.  Keep working on [employer].  Good to see you today.   Follow up plan: Return in about 1 year (around 04/12/2024), or if symptoms worsen or fail to improve, for annual exam, prior fasting for blood work.  [name], MD

## 2023-04-13 NOTE — Assessment & Plan Note (Addendum)
Recent breakthrough seizure possibly due to missed AED dose - pt unsure if missed dose. He is planning to buy weekly pill box to ensure he takes each dose - I encouraged him to do this.  He is also motivated to work on [employer] and lifestyle choices.  He is aware to avoid driving until seizure free for 6 months.  He will continue lamictal 100mg  BID and depakote 125mg  BID.  He will keep neurology f/u 09/2023.

## 2023-04-13 NOTE — Assessment & Plan Note (Signed)
He is considering starting antidepressant prescribed by neurology.

## 2023-05-04 ENCOUNTER — Other Ambulatory Visit: Payer: Self-pay

## 2023-05-28 ENCOUNTER — Other Ambulatory Visit: Payer: Self-pay

## 2023-06-21 ENCOUNTER — Other Ambulatory Visit: Payer: Self-pay

## 2023-08-01 ENCOUNTER — Other Ambulatory Visit (HOSPITAL_COMMUNITY): Payer: Self-pay

## 2023-08-01 ENCOUNTER — Other Ambulatory Visit: Payer: Self-pay

## 2023-09-25 ENCOUNTER — Other Ambulatory Visit: Payer: Self-pay

## 2023-11-01 ENCOUNTER — Other Ambulatory Visit: Payer: Self-pay

## 2023-12-31 ENCOUNTER — Encounter: Payer: Self-pay | Admitting: Pharmacist

## 2023-12-31 ENCOUNTER — Other Ambulatory Visit: Payer: Self-pay

## 2024-01-01 ENCOUNTER — Other Ambulatory Visit (HOSPITAL_COMMUNITY): Payer: Self-pay

## 2024-01-01 ENCOUNTER — Other Ambulatory Visit: Payer: Self-pay

## 2024-02-07 ENCOUNTER — Other Ambulatory Visit: Payer: Self-pay

## 2024-04-05 ENCOUNTER — Other Ambulatory Visit: Payer: Self-pay | Admitting: Family Medicine

## 2024-04-05 DIAGNOSIS — Z13220 Encounter for screening for lipoid disorders: Secondary | ICD-10-CM

## 2024-04-05 DIAGNOSIS — E538 Deficiency of other specified B group vitamins: Secondary | ICD-10-CM

## 2024-04-05 DIAGNOSIS — G40909 Epilepsy, unspecified, not intractable, without status epilepticus: Secondary | ICD-10-CM

## 2024-04-09 ENCOUNTER — Other Ambulatory Visit: Payer: Commercial Managed Care - HMO

## 2024-04-15 ENCOUNTER — Encounter: Payer: Commercial Managed Care - HMO | Admitting: Family Medicine

## 2024-04-15 NOTE — Progress Notes (Deleted)
  Ph: [phone] Fax: [phone]   Patient ID: Martin Gonzalez, male    DOB: 01-Jul-1996, 28 y.o.   MRN: [id number]  This visit was conducted in person.  There were no vitals taken for this visit.   CC: CPE Subjective:   HPI: Martin Gonzalez is a 28 y.o. male presenting on 04/15/2024 for No chief complaint on file.   Seizure disorder - sees neurology Dr Lane, last 03/2023. On depakote  125mg  bid, lamictal  100mg  bid, celexa  20mg  daily. Last seizure - 03/2023 Previous AED tried: Lamictal  200mg  BID caused rash to chest Keppra  caused mood changes   Preventative: Seat belt use discussed Sunscreen use discussed, no changing moles on skin.  Flu shot yearly  COVID vaccine - Pfizer 11/2019,12/2019 Tetanus - unsure  Smoking - none  Alcohol - none MJ - daily Rec drugs - none  Dentist q6 mo Eye exam yearly - has glasses Currently sexually active - 1 partner in past, monogamous. Partner on [facility]. No concern with STDs but agrees to testing. Notes difficulty fully retracting foreskin, incomplete urinary emptying. Declines blood tests however.    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 Interim medical history since our last visit reviewed. Allergies and medications reviewed and updated. Outpatient Medications Prior to Visit  Medication Sig Dispense Refill   citalopram  (CELEXA ) 20 MG tablet Take 1 tablet (20 mg total) by mouth daily. (Patient not taking: Reported on 04/13/2023) 90 tablet 1   cyanocobalamin  (VITAMIN B12) 500 MCG tablet Take 1 tablet (500 mcg total) by mouth daily.     divalproex  (DEPAKOTE ) 125 MG DR tablet Take 1 tablet (125 mg  total) by mouth 2 (two) times daily. 180 tablet 3   lamoTRIgine  (LAMICTAL ) 100 MG tablet Take 1 tablet (100 mg total) by mouth 2 (two) times daily. 180 tablet 3   No facility-administered medications prior to visit.     Per HPI unless specifically indicated in ROS section below Review of Systems  Objective:  There were no vitals taken for this visit.  Wt Readings from Last 3 Encounters:  04/13/23 176 lb 2 oz (79.9 kg)  04/17/22 159 lb (72.1 kg)  11/18/21 167 lb 2 oz (75.8 kg)      Physical Exam    Results for orders placed or performed in visit on 04/18/22  RPR   Collection Time: 04/18/22 10:58 AM  Result Value Ref Range   RPR Ser Ql NON-REACTIVE NON-REACTIVE    Assessment & Plan:   Problem List Items Addressed This Visit   None    No orders of the defined types were placed in this encounter.   No orders of the defined types were placed in this encounter.   There are no Patient Instructions on file for this visit.  Follow up plan: No follow-ups on file.  Anton Blas, MD

## 2024-04-18 ENCOUNTER — Other Ambulatory Visit: Payer: Self-pay

## 2024-04-21 ENCOUNTER — Other Ambulatory Visit: Payer: Self-pay

## 2024-04-21 MED ORDER — DIVALPROEX SODIUM 125 MG PO DR TAB
125.0000 mg | DELAYED_RELEASE_TABLET | Freq: Two times a day (BID) | ORAL | 0 refills | Status: DC
  Filled 2024-04-21: qty 60, 30d supply, fill #0

## 2024-06-05 ENCOUNTER — Other Ambulatory Visit: Payer: Self-pay

## 2024-06-05 MED ORDER — DIVALPROEX SODIUM 125 MG PO DR TAB
125.0000 mg | DELAYED_RELEASE_TABLET | Freq: Two times a day (BID) | ORAL | 0 refills | Status: DC
  Filled 2024-06-05: qty 30, 15d supply, fill #0

## 2024-06-27 ENCOUNTER — Other Ambulatory Visit: Payer: Self-pay

## 2024-06-30 ENCOUNTER — Other Ambulatory Visit: Payer: Self-pay

## 2024-07-01 ENCOUNTER — Other Ambulatory Visit: Payer: Self-pay

## 2024-07-01 ENCOUNTER — Encounter: Payer: Self-pay | Admitting: Emergency Medicine

## 2024-07-01 ENCOUNTER — Emergency Department
Admission: EM | Admit: 2024-07-01 | Discharge: 2024-07-01 | Discharge status: Against Medical Advice | Attending: Emergency Medicine | Admitting: Emergency Medicine

## 2024-07-01 DIAGNOSIS — R059 Cough, unspecified: Secondary | ICD-10-CM | POA: Insufficient documentation

## 2024-07-01 DIAGNOSIS — R569 Unspecified convulsions: Secondary | ICD-10-CM | POA: Insufficient documentation

## 2024-07-01 DIAGNOSIS — Z5321 Procedure and treatment not carried out due to patient leaving prior to being seen by health care provider: Secondary | ICD-10-CM | POA: Diagnosis not present

## 2024-07-01 LAB — CBC WITH DIFFERENTIAL/PLATELET
Abs Immature Granulocytes: 0.03 K/uL (ref 0.00–0.07)
Basophils Absolute: 0 K/uL (ref 0.0–0.1)
Basophils Relative: 0 %
Eosinophils Absolute: 0 K/uL (ref 0.0–0.5)
Eosinophils Relative: 0 %
HCT: 46.3 % (ref 39.0–52.0)
Hemoglobin: 15.3 g/dL (ref 13.0–17.0)
Immature Granulocytes: 0 %
Lymphocytes Relative: 31 %
Lymphs Abs: 2.6 K/uL (ref 0.7–4.0)
MCH: 30.8 pg (ref 26.0–34.0)
MCHC: 33 g/dL (ref 30.0–36.0)
MCV: 93.2 fL (ref 80.0–100.0)
Monocytes Absolute: 0.5 K/uL (ref 0.1–1.0)
Monocytes Relative: 7 %
Neutro Abs: 5.1 K/uL (ref 1.7–7.7)
Neutrophils Relative %: 62 %
Platelets: 240 K/uL (ref 150–400)
RBC: 4.97 MIL/uL (ref 4.22–5.81)
RDW: 12.2 % (ref 11.5–15.5)
WBC: 8.2 K/uL (ref 4.0–10.5)
nRBC: 0 % (ref 0.0–0.2)

## 2024-07-01 LAB — COMPREHENSIVE METABOLIC PANEL WITH GFR
ALT: 16 U/L (ref 0–44)
AST: 25 U/L (ref 15–41)
Albumin: 4.8 g/dL (ref 3.5–5.0)
Alkaline Phosphatase: 57 U/L (ref 38–126)
Anion gap: 15 (ref 5–15)
BUN: 13 mg/dL (ref 6–20)
CO2: 24 mmol/L (ref 22–32)
Calcium: 9.5 mg/dL (ref 8.9–10.3)
Chloride: 99 mmol/L (ref 98–111)
Creatinine, Ser: 1.17 mg/dL (ref 0.61–1.24)
GFR, Estimated: >60 mL/min (ref >60)
Glucose, Bld: 136 mg/dL — ABNORMAL HIGH (ref 70–99)
Potassium: 3.2 mmol/L — ABNORMAL LOW (ref 3.5–5.1)
Sodium: 138 mmol/L (ref 135–145)
Total Bilirubin: 0.8 mg/dL (ref 0.0–1.2)
Total Protein: 7.8 g/dL (ref 6.5–8.1)

## 2024-07-01 LAB — VALPROIC ACID LEVEL: Valproic Acid Lvl: <10 ug/mL — ABNORMAL LOW (ref 50–100)

## 2024-07-01 NOTE — ED Triage Notes (Addendum)
 Pt arrives POV, ambulatory to triage, gait steady, no acute distress noted c/o seizure-like activity pta that lasted around 5 minutes per pt's visitor. Pt was sitting on cough, smoking marijuana (daily use) when he began to have seizure. Pt was post-ictal but seems to be returning to baseline, pt reports feeling out of it and being tired upon arrival to ER. Pt has hx of seizures, last one he had was July 2024. Pt takes Depakote  and Lamictal . Pt denies pain.

## 2024-07-02 ENCOUNTER — Other Ambulatory Visit: Payer: Self-pay

## 2024-07-02 MED ORDER — LAMOTRIGINE 100 MG PO TABS
100.0000 mg | ORAL_TABLET | Freq: Two times a day (BID) | ORAL | 0 refills | Status: DC
  Filled 2024-07-02: qty 14, 7d supply, fill #0

## 2024-07-02 MED ORDER — DIVALPROEX SODIUM 125 MG PO DR TAB
125.0000 mg | DELAYED_RELEASE_TABLET | Freq: Two times a day (BID) | ORAL | 0 refills | Status: DC
  Filled 2024-07-02: qty 14, 7d supply, fill #0

## 2024-07-03 ENCOUNTER — Other Ambulatory Visit: Payer: Self-pay

## 2024-07-03 LAB — LAMOTRIGINE LEVEL: Lamotrigine Lvl: 1.8 ug/mL — ABNORMAL LOW (ref 2.0–20.0)

## 2024-07-09 ENCOUNTER — Other Ambulatory Visit: Payer: Self-pay

## 2024-07-09 MED ORDER — LAMOTRIGINE 100 MG PO TABS
100.0000 mg | ORAL_TABLET | Freq: Two times a day (BID) | ORAL | 2 refills | Status: AC
  Filled 2024-07-09: qty 180, 90d supply, fill #0

## 2024-07-09 MED ORDER — VALTOCO 10 MG DOSE 10 MG/0.1ML NA LIQD
10.0000 mg | NASAL | 0 refills | Status: AC | PRN
  Filled 2024-07-09: qty 2, 5d supply, fill #0

## 2024-07-09 MED ORDER — DIVALPROEX SODIUM 125 MG PO DR TAB
125.0000 mg | DELAYED_RELEASE_TABLET | Freq: Two times a day (BID) | ORAL | 2 refills | Status: AC
  Filled 2024-07-09: qty 180, 90d supply, fill #0
